# Patient Record
Sex: Female | Born: 1990 | Race: White | Hispanic: No | Marital: Single | Smoking: Never smoker
Health system: Southern US, Community
[De-identification: ages and names within clinical notes are randomized; demographics above are authoritative.]

## PROBLEM LIST (undated history)

## (undated) DIAGNOSIS — N200 Calculus of kidney: Secondary | ICD-10-CM

## (undated) DIAGNOSIS — N83209 Unspecified ovarian cyst, unspecified side: Secondary | ICD-10-CM

## (undated) HISTORY — PX: MYRINGOTOMY: SUR874

## (undated) HISTORY — PX: LITHOTRIPSY: SUR834

## (undated) HISTORY — PX: KIDNEY STONE SURGERY: SHX686

## (undated) HISTORY — PX: ADENOIDECTOMY: SUR15

---
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
## 2011-06-12 ENCOUNTER — Emergency Department (HOSPITAL_COMMUNITY)
Admission: EM | Admit: 2011-06-12 | Discharge: 2011-06-13 | Discharge status: Against Medical Advice | Payer: BC Managed Care – HMO | Attending: Emergency Medicine | Admitting: Emergency Medicine

## 2011-06-12 ENCOUNTER — Encounter: Payer: Self-pay | Admitting: *Deleted

## 2011-06-12 DIAGNOSIS — R109 Unspecified abdominal pain: Secondary | ICD-10-CM | POA: Insufficient documentation

## 2011-06-12 HISTORY — DX: Unspecified ovarian cyst, unspecified side: N83.209

## 2011-06-12 HISTORY — DX: Calculus of kidney: N20.0

## 2011-06-12 NOTE — ED Notes (Signed)
C/o R flank pain, "feels like last kidney stone", pinpoints to bilateral flank pain, radiating into bilateral groin, L>R, also nv, (denies: diarrhea, hematuria, radiation of pain down leg, vaginal sx or fever). Last kidney stone ~25yr ago, has a GU MD in [place]. Has had 2-3 lithotripsy procedures in past.

## 2011-06-13 LAB — POCT I-STAT, CHEM 8
BUN: 11 mg/dL (ref 6–23)
Hemoglobin: 13.6 g/dL (ref 12.0–15.0)
Sodium: 139 mEq/L (ref 135–145)
TCO2: 22 mmol/L (ref 0–100)

## 2011-06-13 LAB — URINALYSIS, ROUTINE W REFLEX MICROSCOPIC
Glucose, UA: NEGATIVE mg/dL
Leukocytes, UA: NEGATIVE
Protein, ur: NEGATIVE mg/dL
Specific Gravity, Urine: 1.018 (ref 1.005–1.030)

## 2011-06-13 LAB — CBC
MCH: 27 pg (ref 26.0–34.0)
Platelets: 271 10*3/uL (ref 150–400)
RBC: 4.55 MIL/uL (ref 3.87–5.11)
WBC: 8.4 10*3/uL (ref 4.0–10.5)

## 2011-06-13 LAB — DIFFERENTIAL
Eosinophils Absolute: 0.1 10*3/uL (ref 0.0–0.7)
Lymphocytes Relative: 41 % (ref 12–46)
Lymphs Abs: 3.4 10*3/uL (ref 0.7–4.0)
Neutro Abs: 4.4 10*3/uL (ref 1.7–7.7)
Neutrophils Relative %: 52 % (ref 43–77)

## 2011-06-13 NOTE — ED Provider Notes (Signed)
20 year old female with report of flank pain. When I went to evaluate the patient she requested to leave AMA. Patient appears to be in no acute distress, and understands risks of leaving prior to completion of workup. Patient was given [facility] paperwork and will be discharged as an AMA. No formal history of present illness or physical exam was done on this patient  Olivia Mackie, MD 06/13/11 [phone]

## 2011-11-19 ENCOUNTER — Emergency Department (INDEPENDENT_AMBULATORY_CARE_PROVIDER_SITE_OTHER): Payer: BC Managed Care – HMO

## 2011-11-19 ENCOUNTER — Encounter (HOSPITAL_COMMUNITY): Payer: Self-pay

## 2011-11-19 ENCOUNTER — Emergency Department (INDEPENDENT_AMBULATORY_CARE_PROVIDER_SITE_OTHER)
Admission: EM | Admit: 2011-11-19 | Discharge: 2011-11-19 | Disposition: A | Discharge status: Home / Self Care | Payer: BC Managed Care – HMO | Source: Home / Self Care

## 2011-11-19 DIAGNOSIS — IMO0002 Reserved for concepts with insufficient information to code with codable children: Secondary | ICD-10-CM

## 2011-11-19 DIAGNOSIS — S8000XA Contusion of unspecified knee, initial encounter: Secondary | ICD-10-CM

## 2011-11-19 DIAGNOSIS — S80219A Abrasion, unspecified knee, initial encounter: Secondary | ICD-10-CM

## 2011-11-19 MED ORDER — IBUPROFEN 800 MG PO TABS: 800.0000 mg | ORAL_TABLET | Freq: Three times a day (TID) | ORAL | Status: AC

## 2011-11-19 MED ORDER — BACITRACIN 500 UNIT/GM EX OINT: 1.0000 "application " | TOPICAL_OINTMENT | Freq: Once | CUTANEOUS | Status: DC

## 2011-11-19 NOTE — ED Notes (Signed)
Pt fell last pm and has abrasions on both knees, no active bleeding noted.

## 2011-11-19 NOTE — Discharge Instructions (Signed)
Ice your knees 3-4 times a day for 15-20 minutes or more often as needed for discomfort. Wash the wounds twice daily with soap and water. Then apply an antibiotic ointment and a clean dressing. Watch for signs of infection, such as redness, swelling or drainage. If this develops return as soon as possible for recheck.   Abrasions Abrasions are skin scrapes. Their treatment depends on how large and deep the abrasion is. Abrasions do not extend through all layers of the skin. A cut or lesion through all skin layers is called a laceration. HOME CARE INSTRUCTIONS   If you were given a dressing, change it at least once a day or as instructed by your caregiver. If the bandage sticks, soak it off with a solution of water or hydrogen peroxide.   Twice a day, wash the area with soap and water to remove all the cream/ointment. You may do this in a sink, under a tub faucet, or in a shower. Rinse off the soap and pat dry with a clean towel. Look for signs of infection (see below).   Reapply cream/ointment according to your caregiver's instruction. This will help prevent infection and keep the bandage from sticking. Telfa or gauze over the wound and under the dressing or wrap will also help keep the bandage from sticking.   If the bandage becomes wet, dirty, or develops a foul smell, change it as soon as possible.   Only take over-the-counter or prescription medicines for pain, discomfort, or fever as directed by your caregiver.  SEEK IMMEDIATE MEDICAL CARE IF:   Increasing pain in the wound.   Signs of infection develop: redness, swelling, surrounding area is tender to touch, or pus coming from the wound.   You have a fever.   Any foul smell coming from the wound or dressing.  Most skin wounds heal within ten days. Facial wounds heal faster. However, an infection may occur despite proper treatment. You should have the wound checked for signs of infection within 24 to 48 hours or sooner if problems  arise. If you were not given a wound-check appointment, look closely at the wound yourself on the second day for early signs of infection listed above. MAKE SURE YOU:   Understand these instructions.   Will watch your condition.   Will get help right away if you are not doing well or get worse.  Document Released: 05/04/2005 Document Revised: 07/14/2011 Document Reviewed: 06/28/2011 [facility] Patient Information 2012 [place], [place].

## 2011-11-19 NOTE — ED Provider Notes (Signed)
Medical screening examination/treatment/procedure(s) were performed by non-physician practitioner and as supervising physician I was immediately available for consultation/collaboration.  Alen Bleacher, MD 11/19/11 2102

## 2011-11-19 NOTE — ED Provider Notes (Signed)
History     CSN: [id number]  Arrival date & time 11/19/11  1538   None     Chief Complaint  Patient presents with  . Knee Injury    (Consider location/radiation/quality/duration/timing/severity/associated sxs/prior treatment) HPI Comments: Patient states that she fell last night landing on both of her knees. She presents with abrasions of both knees, and also complains of knee pain and swelling. Pain worsens with movement and walking. She has not taken anything for her discomfort.   Past Medical History  Diagnosis Date  . Kidney stone   . Kidney stone   . Kidney stones   . Ovarian cyst     Past Surgical History  Procedure Date  . Lithotripsy   . Kidney stone surgery   . Myringotomy   . Adenoidectomy     History reviewed. No pertinent family history.  History  Substance Use Topics  . Smoking status: Never Smoker   . Smokeless tobacco: Not on file  . Alcohol Use: No    OB History    Grav Para Term Preterm Abortions TAB SAB Ect Mult Living                  Review of Systems  Constitutional: Negative for fever and chills.  Musculoskeletal: Positive for joint swelling.  Skin: Positive for wound.  Neurological: Negative for weakness and numbness.    Allergies  Penicillins  Home Medications   Current Outpatient Rx  Name Route Sig Dispense Refill  . IBUPROFEN 800 MG PO TABS Oral Take 1 tablet (800 mg total) by mouth 3 (three) times daily. 15 tablet 0  . NORETHINDRONE ACET-ETHINYL EST 1-20 MG-MCG PO TABS Oral Take 1 tablet by mouth daily.        BP 123/66  Pulse 88  Temp(Src) 98.1 F (36.7 C) (Oral)  Resp 18  SpO2 100%  LMP 10/29/2011  Physical Exam  Nursing note and vitals reviewed. Constitutional: She appears well-developed and well-nourished. No distress.  HENT:  Head: Normocephalic and atraumatic.  Musculoskeletal:       Right knee: She exhibits swelling (anterior knee) and bony tenderness (TTP patella and tuberosity of tibia. ). She  exhibits normal range of motion, no ecchymosis, no laceration, no erythema, normal alignment, no LCL laxity, normal patellar mobility and no MCL laxity. No medial joint line, no lateral joint line, no MCL, no LCL and no patellar tendon tenderness noted.       Left knee: She exhibits normal range of motion, no swelling, no effusion, no ecchymosis, no deformity, no laceration, no erythema, normal alignment, no LCL laxity, normal patellar mobility, no bony tenderness and no MCL laxity. No medial joint line, no lateral joint line, no MCL, no LCL and no patellar tendon tenderness noted.       Abrasions noted bilat anterior knees.   Neurological: She is alert.  Skin: Skin is warm and dry.  Psychiatric: She has a normal mood and affect.    ED Course  Procedures (including critical care time)  Labs Reviewed - No data to display Dg Knee Complete 4 Views Right  11/19/2011  *RADIOLOGY REPORT*  Clinical Data: Right knee pain/abrasion  RIGHT KNEE - COMPLETE 4+ VIEW  Comparison: None.  Findings: No fracture or dislocation is seen.  The joint spaces are preserved.  Tiny radiopaque density in the anterior infrapatellar soft tissues, correlate for radiopaque foreign body.  No suprapatellar knee joint effusion.  IMPRESSION: No fracture or dislocation is seen.  Tiny radiopaque density in  the anterior infrapatellar soft tissues, correlate for radiopaque foreign body.  Original Report Authenticated By: Charline Bills, M.D.     1. Abrasion of knee   2. Contusion of knee       MDM  Xray reviewed by myself and radiologist.        Melody Comas, PA 11/19/11 [phone]

## 2013-05-31 ENCOUNTER — Emergency Department (HOSPITAL_COMMUNITY): Payer: BC Managed Care – HMO

## 2013-05-31 ENCOUNTER — Emergency Department (HOSPITAL_COMMUNITY)
Admission: EM | Admit: 2013-05-31 | Discharge: 2013-06-01 | Disposition: A | Discharge status: Home / Self Care | Payer: BC Managed Care – HMO | Attending: Emergency Medicine | Admitting: Emergency Medicine

## 2013-05-31 ENCOUNTER — Emergency Department (HOSPITAL_COMMUNITY)
Admission: EM | Admit: 2013-05-31 | Discharge: 2013-05-31 | Disposition: A | Discharge status: Home / Self Care | Payer: BC Managed Care – HMO | Attending: Emergency Medicine | Admitting: Emergency Medicine

## 2013-05-31 ENCOUNTER — Encounter (HOSPITAL_COMMUNITY): Payer: Self-pay | Admitting: Emergency Medicine

## 2013-05-31 DIAGNOSIS — R112 Nausea with vomiting, unspecified: Secondary | ICD-10-CM | POA: Insufficient documentation

## 2013-05-31 DIAGNOSIS — Z880 Allergy status to penicillin: Secondary | ICD-10-CM | POA: Insufficient documentation

## 2013-05-31 DIAGNOSIS — R300 Dysuria: Secondary | ICD-10-CM | POA: Insufficient documentation

## 2013-05-31 DIAGNOSIS — R319 Hematuria, unspecified: Secondary | ICD-10-CM | POA: Insufficient documentation

## 2013-05-31 DIAGNOSIS — R1032 Left lower quadrant pain: Secondary | ICD-10-CM | POA: Insufficient documentation

## 2013-05-31 DIAGNOSIS — Z8742 Personal history of other diseases of the female genital tract: Secondary | ICD-10-CM | POA: Insufficient documentation

## 2013-05-31 DIAGNOSIS — R109 Unspecified abdominal pain: Secondary | ICD-10-CM

## 2013-05-31 DIAGNOSIS — Z3202 Encounter for pregnancy test, result negative: Secondary | ICD-10-CM | POA: Insufficient documentation

## 2013-05-31 DIAGNOSIS — N200 Calculus of kidney: Secondary | ICD-10-CM | POA: Insufficient documentation

## 2013-05-31 DIAGNOSIS — Z791 Long term (current) use of non-steroidal anti-inflammatories (NSAID): Secondary | ICD-10-CM | POA: Insufficient documentation

## 2013-05-31 DIAGNOSIS — Z87442 Personal history of urinary calculi: Secondary | ICD-10-CM | POA: Insufficient documentation

## 2013-05-31 LAB — CBC WITH DIFFERENTIAL/PLATELET
Basophils Relative: 0 % (ref 0–1)
Eosinophils Absolute: 0.1 10*3/uL (ref 0.0–0.7)
HCT: 37.6 % (ref 36.0–46.0)
Hemoglobin: 12.4 g/dL (ref 12.0–15.0)
MCH: 27.6 pg (ref 26.0–34.0)
MCHC: 33 g/dL (ref 30.0–36.0)
Monocytes Absolute: 0.5 10*3/uL (ref 0.1–1.0)
Monocytes Relative: 5 % (ref 3–12)
Neutro Abs: 5.6 10*3/uL (ref 1.7–7.7)

## 2013-05-31 LAB — COMPREHENSIVE METABOLIC PANEL
ALT: 20 U/L (ref 0–35)
AST: 17 U/L (ref 0–37)
Calcium: 9.9 mg/dL (ref 8.4–10.5)
Sodium: 136 mEq/L (ref 135–145)
Total Protein: 7.7 g/dL (ref 6.0–8.3)

## 2013-05-31 LAB — URINALYSIS, ROUTINE W REFLEX MICROSCOPIC
Protein, ur: NEGATIVE mg/dL
Specific Gravity, Urine: 1.022 (ref 1.005–1.030)
Urobilinogen, UA: 0.2 mg/dL (ref 0.0–1.0)
Urobilinogen, UA: 1 mg/dL (ref 0.0–1.0)

## 2013-05-31 LAB — URINE MICROSCOPIC-ADD ON

## 2013-05-31 MED ORDER — OXYCODONE-ACETAMINOPHEN 10-325 MG PO TABS: 0.5000 | ORAL_TABLET | ORAL | Status: DC | PRN

## 2013-05-31 MED ORDER — ONDANSETRON HCL 4 MG/2ML IJ SOLN
4.0000 mg | Freq: Once | INTRAMUSCULAR | Status: AC
  Administered 2013-05-31: 4 mg via INTRAVENOUS
  Filled 2013-05-31: qty 2

## 2013-05-31 MED ORDER — SODIUM CHLORIDE 0.9 % IV BOLUS (SEPSIS)
1000.0000 mL | Freq: Once | INTRAVENOUS | Status: AC
  Administered 2013-05-31: 1000 mL via INTRAVENOUS

## 2013-05-31 MED ORDER — NAPROXEN 500 MG PO TABS: 500.0000 mg | ORAL_TABLET | Freq: Two times a day (BID) | ORAL | Status: DC

## 2013-05-31 MED ORDER — MORPHINE SULFATE 2 MG/ML IJ SOLN
2.0000 mg | Freq: Once | INTRAMUSCULAR | Status: AC
  Administered 2013-05-31: 2 mg via INTRAVENOUS
  Filled 2013-05-31: qty 1

## 2013-05-31 MED ORDER — KETOROLAC TROMETHAMINE 30 MG/ML IJ SOLN
30.0000 mg | Freq: Once | INTRAMUSCULAR | Status: AC
  Administered 2013-05-31: 30 mg via INTRAVENOUS
  Filled 2013-05-31: qty 1

## 2013-05-31 MED ORDER — ONDANSETRON HCL 8 MG PO TABS: 4.0000 mg | ORAL_TABLET | Freq: Three times a day (TID) | ORAL | Status: DC | PRN

## 2013-05-31 MED ORDER — TAMSULOSIN HCL 0.4 MG PO CAPS: 0.4000 mg | ORAL_CAPSULE | Freq: Every day | ORAL | Status: DC

## 2013-05-31 MED ORDER — HYDROMORPHONE HCL PF 1 MG/ML IJ SOLN
1.0000 mg | Freq: Once | INTRAMUSCULAR | Status: AC
  Administered 2013-05-31: 1 mg via INTRAVENOUS
  Filled 2013-05-31: qty 1

## 2013-05-31 NOTE — ED Notes (Signed)
PT states that she began to have LLQ pain last pm; pt states that she was seen last pm for same thing; pt states that she was given Naproxen and [facility]; pt states that she has continued to have LLQ pain that has not been relieved with the medication; pt states that she has had nausea and vomit x2 today.

## 2013-05-31 NOTE — ED Provider Notes (Signed)
CSN: [id number]     Arrival date & time 05/31/13  2007 History   First MD Initiated Contact with Patient 05/31/13 2032     Chief Complaint  Patient presents with  . Abdominal Pain   (Consider location/radiation/quality/duration/timing/severity/associated sxs/prior Treatment) HPI  April Horton 22 year old female presents the emergency department chief complaint of left flank pain. Patient was seen early this morning by PA Dammen. Patient has a history of multiple kidney stones.  She states that she has had left pain radiating to the left lower quadrant of the abdomen that began last night around 11 PM.  He has been progressively worsening.  She said that yesterday her urinalysis here in the emergency department was negative.  However she followed up at the clinic at her school today and had positive blood in her urine.  Patient returns to the emergency department for treatment of pain.  She does have multiple episodes of nausea and vomiting.  Last menstrual period was one week ago.She denies any vaginal bleeding or vaginal discharge.  She has not used any medications or treatment for symptoms. Denies any other aggravating or alleviating factors. No other associated symptoms. No fever, chills or sweats.   Past Medical History  Diagnosis Date  . Kidney stone   . Kidney stone   . Kidney stones   . Ovarian cyst    Past Surgical History  Procedure Laterality Date  . Lithotripsy    . Kidney stone surgery    . Myringotomy    . Adenoidectomy     No family history on file. History  Substance Use Topics  . Smoking status: Never Smoker   . Smokeless tobacco: Not on file  . Alcohol Use: No   OB History   Grav Para Term Preterm Abortions TAB SAB Ect Mult Living                 Review of Systems Ten systems reviewed and are negative for acute change, except as noted in the HPI.   Allergies  Demerol; Penicillins; and Shellfish allergy  Home Medications   Current  Outpatient Rx  Name  Route  Sig  Dispense  Refill  . naproxen (NAPROSYN) 500 MG tablet   Oral   Take 1 tablet (500 mg total) by mouth 2 (two) times daily.   30 tablet   0    BP 133/72  Pulse 95  Temp(Src) 98.4 F (36.9 C) (Oral)  Resp 16  SpO2 96%  LMP 05/25/2013 Physical Exam Physical Exam  Nursing note and vitals reviewed. Constitutional: She is oriented to person, place, and time.  Patient is tearful but stoic. HENT:  Head: Normocephalic and atraumatic.  Eyes: Conjunctivae normal and EOM are normal. Pupils are equal, round, and reactive to light. No scleral icterus.  Neck: Normal range of motion.  Cardiovascular: Normal rate, regular rhythm and normal heart sounds.  Exam reveals no gallop and no friction rub.   No murmur heard. Pulmonary/Chest: Effort normal and breath sounds normal. No respiratory distress.  Abdominal: Soft. Bowel sounds are normal. She exhibits no distension and no mass. There is no tenderness. +CVA tenderness L side Neurological: She is alert and oriented to person, place, and time.  Skin: Skin is warm and dry. She is not diaphoretic.    ED Course  Procedures (including critical care time) Labs Review Labs Reviewed  URINALYSIS, ROUTINE W REFLEX MICROSCOPIC   Imaging Review No results found.  EKG Interpretation   None  MDM   1. Kidney stone     11:14 PM BP 133/72  Pulse 95  Temp(Src) 98.4 F (36.9 C) (Oral)  Resp 16  SpO2 96%  LMP 05/25/2013 Patient with sxs consistent with her previous kidney stones. She has no LLQ abdominal pain  On exam.  She again declines Pelvic examination. I have discussed the differential dx of LQ abdominal pain and risks with the patient who appears to be competent to make the decision. KUB shows Large stool burden. Her UA shows moderate hgb and white cells. Patient is resting comfortably.  12:24 AM She has a history of litotropsy. Will get Renal US to look for hydronephrosis.    9:59 AM BP  116/57  Pulse 79  Temp(Src) 98.4 F (36.9 C) (Oral)  Resp 18  SpO2 99%  LMP 05/25/2013 Patient [place] negative for signs of obstruction.  Patient denies any further pain at this time. Patient advised to take laxative for large stool burden follow up this week with urology. Arthor Captain, PA-C 06/01/13 1000

## 2013-05-31 NOTE — ED Notes (Signed)
Pt reports back pain that started one week ago and now moved to the lower pelvis area. Pain accompanied with nausea and vomittingx1 and blood in urine.

## 2013-05-31 NOTE — ED Notes (Signed)
Patient is alert and oriented x3.  She was given DC instructions and follow up visit instructions.  Patient gave verbal understanding. She was DC ambulatory under her own power to home.  V/S stable.  He was not showing any signs of distress on DC 

## 2013-05-31 NOTE — ED Provider Notes (Signed)
Medical screening examination/treatment/procedure(s) were performed by non-physician practitioner and as supervising physician I was immediately available for consultation/collaboration.  EKG Interpretation   None        Zandrea Kenealy, MD 05/31/13 0456 

## 2013-05-31 NOTE — ED Provider Notes (Signed)
CSN: [id number]     Arrival date & time 05/31/13  0039 History   First MD Initiated Contact with Patient 05/31/13 0102     Chief Complaint  Patient presents with  . Abdominal Pain  . Emesis   HPI  History provided by the patient. Patient is a 22 year old female with previous history of kidney stones with lithotripsy who presents with complaints of left flank and lower abdominal pain. Pain began around 11 PM and has worsened. Pain feels similar to previous kidney stones. It has been associated with episodes of nausea and vomiting. Patient also reports having slight dysuria and hematuria. She denies any vaginal bleeding or vaginal discharge. Her last normal menstrual cycle was one week ago. She has not used any medications or treatment for symptoms. Denies any other aggravating or alleviating factors. No other associated symptoms. No fever, chills or sweats.    Past Medical History  Diagnosis Date  . Kidney stone   . Kidney stone   . Kidney stones   . Ovarian cyst    Past Surgical History  Procedure Laterality Date  . Lithotripsy    . Kidney stone surgery    . Myringotomy    . Adenoidectomy     History reviewed. No pertinent family history. History  Substance Use Topics  . Smoking status: Never Smoker   . Smokeless tobacco: Not on file  . Alcohol Use: No   OB History   Grav Para Term Preterm Abortions TAB SAB Ect Mult Living                 Review of Systems  Constitutional: Negative for fever, chills and diaphoresis.  Respiratory: Negative for shortness of breath.   Cardiovascular: Negative for chest pain.  Gastrointestinal: Positive for nausea, vomiting and abdominal pain. Negative for diarrhea and constipation.  Genitourinary: Positive for dysuria, hematuria and flank pain. Negative for frequency, vaginal bleeding, vaginal discharge and menstrual problem.  All other systems reviewed and are negative.    Allergies  Demerol; Penicillins; and Shellfish allergy  Home  Medications  No current outpatient prescriptions on file. BP 117/67  Pulse 93  Temp(Src) 97.6 F (36.4 C) (Oral)  Resp 18  SpO2 98%  LMP 05/25/2013 Physical Exam  Nursing note and vitals reviewed. Constitutional: She is oriented to person, place, and time. She appears well-developed and well-nourished. No distress.  HENT:  Head: Normocephalic.  Cardiovascular: Normal rate and regular rhythm.   No murmur heard. Pulmonary/Chest: Effort normal and breath sounds normal. No respiratory distress. She has no wheezes. She has no rales.  Abdominal: Soft. There is tenderness in the left lower quadrant. There is CVA tenderness (Left). There is no rigidity, no rebound, no guarding and no tenderness at McBurney's point.  Musculoskeletal: Normal range of motion.  Neurological: She is alert and oriented to person, place, and time.  Skin: Skin is warm and dry. No rash noted.  Psychiatric: She has a normal mood and affect. Her behavior is normal.    ED Course  Procedures   COORDINATION OF CARE:  Nursing notes reviewed. Vital signs reviewed. Initial pt interview and examination performed.   1:56 AM-patient seen and evaluated. Patient appears in mild to moderate discomfort. No acute distress. She reports symptoms similar to previous kidney stones. Discussed work up plan with pt at bedside, which includes urinalysis, lab testing and treatment for pain and nausea. Pt agrees with plan.  Patient reports having improvement after medications. Her lab testing and urinalysis has not shown  any signs of concerning cause to explain her symptoms. I discussed with the patient recommendations to perform a pelvic exam to be sure symptoms were not caused by gynecological problem. Patient does not wish to have a pelvic exam and refused. I did discuss possible causes of her pain including possible ovarian torsion, ovarian cysts, pelvic infection, kidney stone, and colitis. I discussed with the patient's the possible  risks of no further testing as well as the alternatives to testing. At this time patient feels improved does not wish to have further testing. I gave her strong return precautions and reminded her that she may return at any time for further evaluation. I also strongly encouraged a recheck of symptoms in the next 24 hours. Patient expressed her understanding.   Treatment plan initiated: Medications  sodium chloride 0.9 % bolus 1,000 mL (not administered)  ketorolac (TORADOL) 30 MG/ML injection 30 mg (30 mg Intravenous Given 05/31/13 0209)  ondansetron (ZOFRAN) injection 4 mg (4 mg Intravenous Given 05/31/13 0209)  morphine 2 MG/ML injection 2 mg (2 mg Intravenous Given 05/31/13 0209)   Results for orders placed during the hospital encounter of 05/31/13  URINALYSIS, ROUTINE W REFLEX MICROSCOPIC      Result Value Range   Color, Urine YELLOW  YELLOW   APPearance CLEAR  CLEAR   Specific Gravity, Urine 1.020  1.005 - 1.030   pH 6.0  5.0 - 8.0   Glucose, UA NEGATIVE  NEGATIVE mg/dL   Hgb urine dipstick NEGATIVE  NEGATIVE   Bilirubin Urine NEGATIVE  NEGATIVE   Ketones, ur NEGATIVE  NEGATIVE mg/dL   Protein, ur NEGATIVE  NEGATIVE mg/dL   Urobilinogen, UA 0.2  0.0 - 1.0 mg/dL   Nitrite NEGATIVE  NEGATIVE   Leukocytes, UA SMALL (*) NEGATIVE  COMPREHENSIVE METABOLIC PANEL      Result Value Range   Sodium 136  135 - 145 mEq/L   Potassium 4.4  3.5 - 5.1 mEq/L   Chloride 102  96 - 112 mEq/L   CO2 23  19 - 32 mEq/L   Glucose, Bld 118 (*) 70 - 99 mg/dL   BUN 17  6 - 23 mg/dL   Creatinine, Ser 1.61  0.50 - 1.10 mg/dL   Calcium 9.9  8.4 - 09.6 mg/dL   Total Protein 7.7  6.0 - 8.3 g/dL   Albumin 4.1  3.5 - 5.2 g/dL   AST 17  0 - 37 U/L   ALT 20  0 - 35 U/L   Alkaline Phosphatase 105  39 - 117 U/L   Total Bilirubin 0.2 (*) 0.3 - 1.2 mg/dL   GFR calc non Af Amer >90  >90 mL/min   GFR calc Af Amer >90  >90 mL/min  CBC WITH DIFFERENTIAL      Result Value Range   WBC 9.8  4.0 - 10.5 K/uL   RBC  4.49  3.87 - 5.11 MIL/uL   Hemoglobin 12.4  12.0 - 15.0 g/dL   HCT 04.5  40.9 - 81.1 %   MCV 83.7  78.0 - 100.0 fL   MCH 27.6  26.0 - 34.0 pg   MCHC 33.0  30.0 - 36.0 g/dL   RDW 91.4  78.2 - 95.6 %   Platelets 300  150 - 400 K/uL   Neutrophils Relative % 58  43 - 77 %   Neutro Abs 5.6  1.7 - 7.7 K/uL   Lymphocytes Relative 35  12 - 46 %   Lymphs Abs 3.5  0.7 -  4.0 K/uL   Monocytes Relative 5  3 - 12 %   Monocytes Absolute 0.5  0.1 - 1.0 K/uL   Eosinophils Relative 1  0 - 5 %   Eosinophils Absolute 0.1  0.0 - 0.7 K/uL   Basophils Relative 0  0 - 1 %   Basophils Absolute 0.0  0.0 - 0.1 K/uL  URINE MICROSCOPIC-ADD ON      Result Value Range   Squamous Epithelial / LPF FEW (*) RARE   WBC, UA 3-6  <3 WBC/hpf   Bacteria, UA RARE  RARE  POCT PREGNANCY, URINE      Result Value Range   Preg Test, Ur NEGATIVE  NEGATIVE         MDM   1. Abdominal pain   2. Flank pain        Angus Seller, PA-C 05/31/13 [phone]

## 2013-06-01 ENCOUNTER — Emergency Department (HOSPITAL_COMMUNITY): Payer: BC Managed Care – HMO

## 2013-06-01 NOTE — ED Provider Notes (Signed)
Medical screening examination/treatment/procedure(s) were performed by non-physician practitioner and as supervising physician I was immediately available for consultation/collaboration.  Kaiah Hosea T Blayke Pinera, MD 06/01/13 2238 

## 2013-06-01 NOTE — ED Notes (Signed)
U/s at bedside

## 2013-06-02 LAB — URINE CULTURE

## 2013-09-21 IMAGING — CR DG KNEE COMPLETE 4+V*R*
4 series · 4 of 4 positions shown · non-contrast
Comparison: None.

CLINICAL DATA: Right knee pain/abrasion

RIGHT KNEE - COMPLETE 4+ VIEW

[view not recorded (1 of 4)]
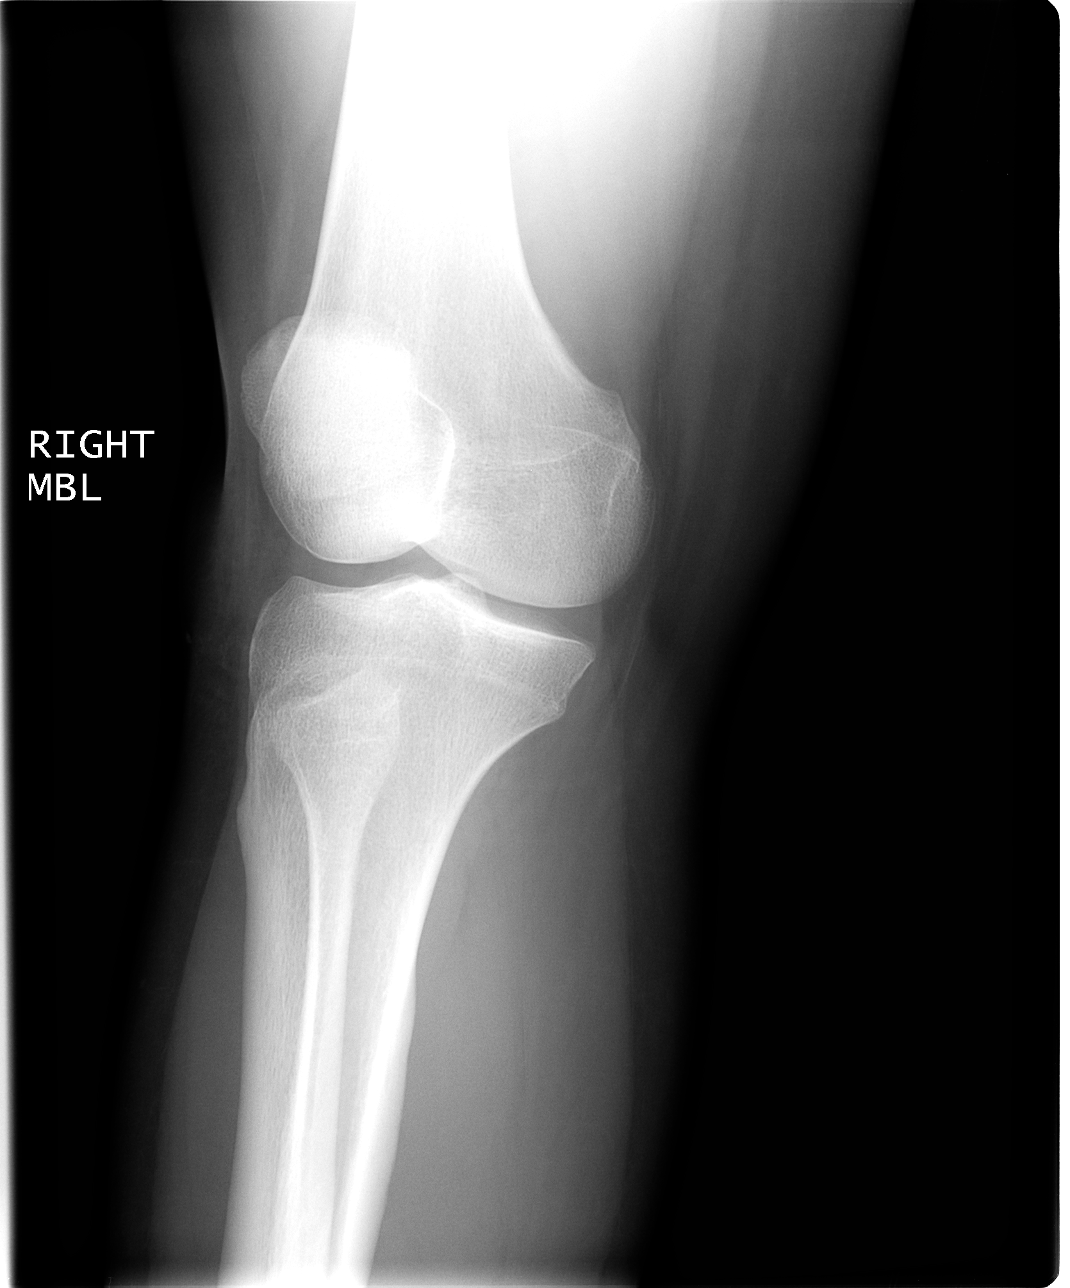

[view not recorded (2 of 4)]
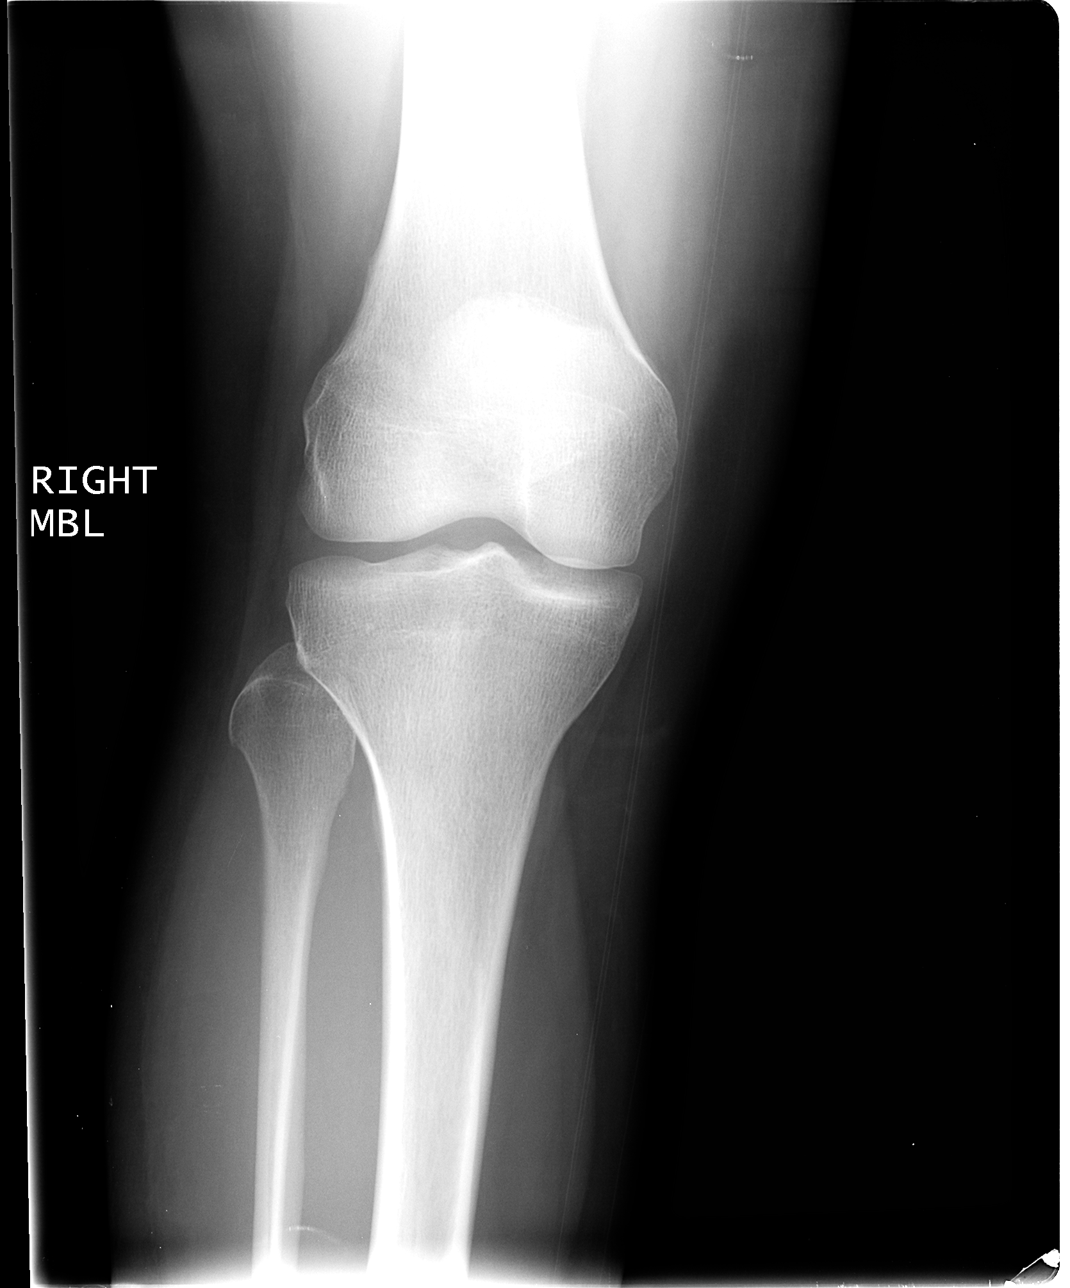

[view not recorded (3 of 4)]
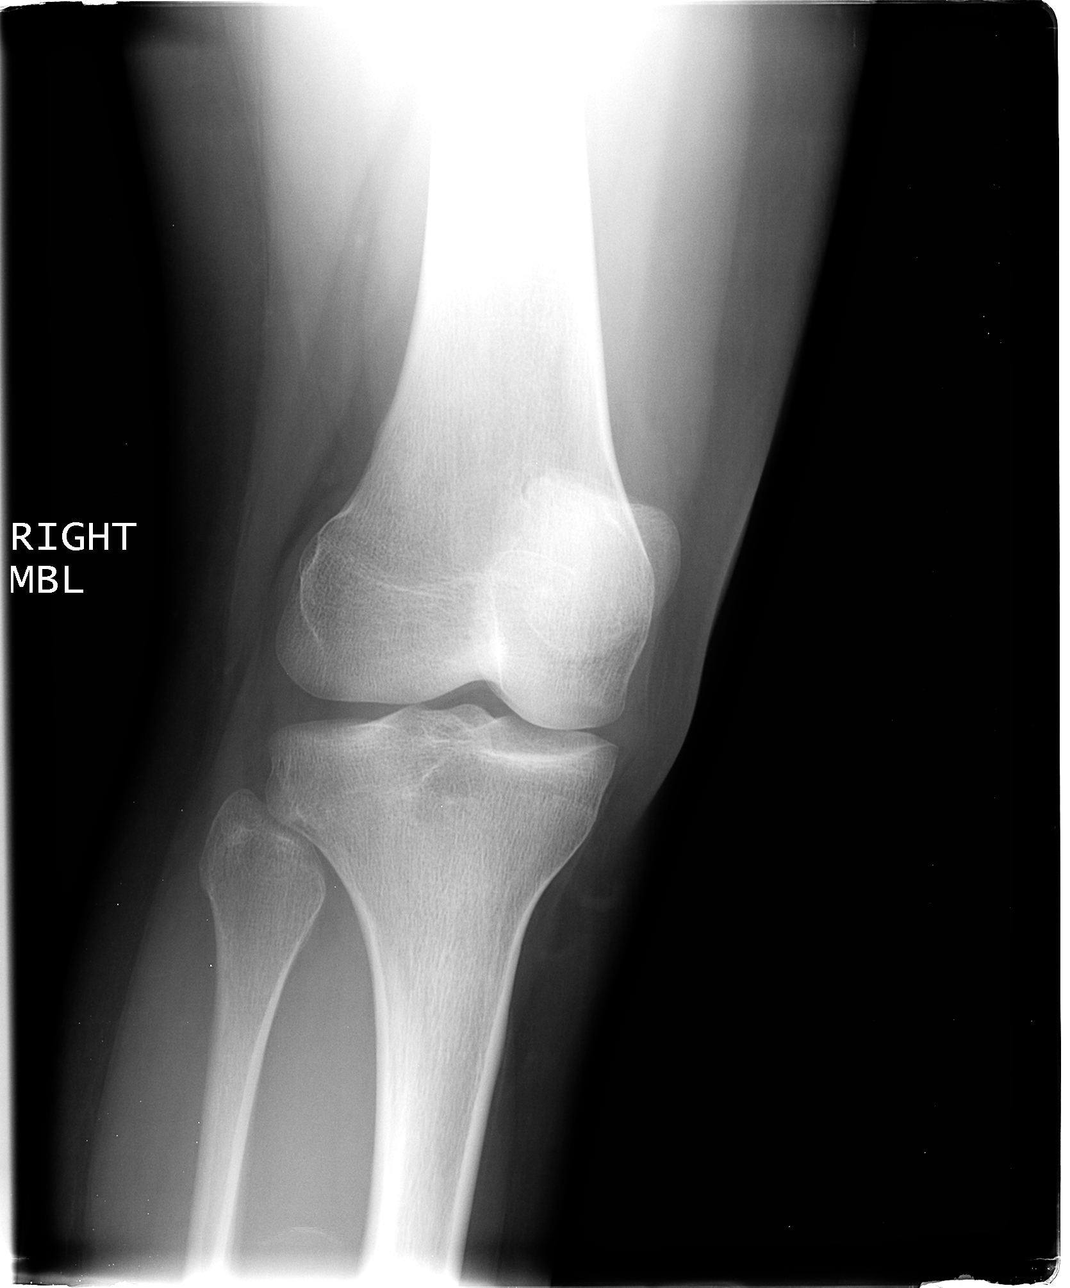

[view not recorded (4 of 4)]
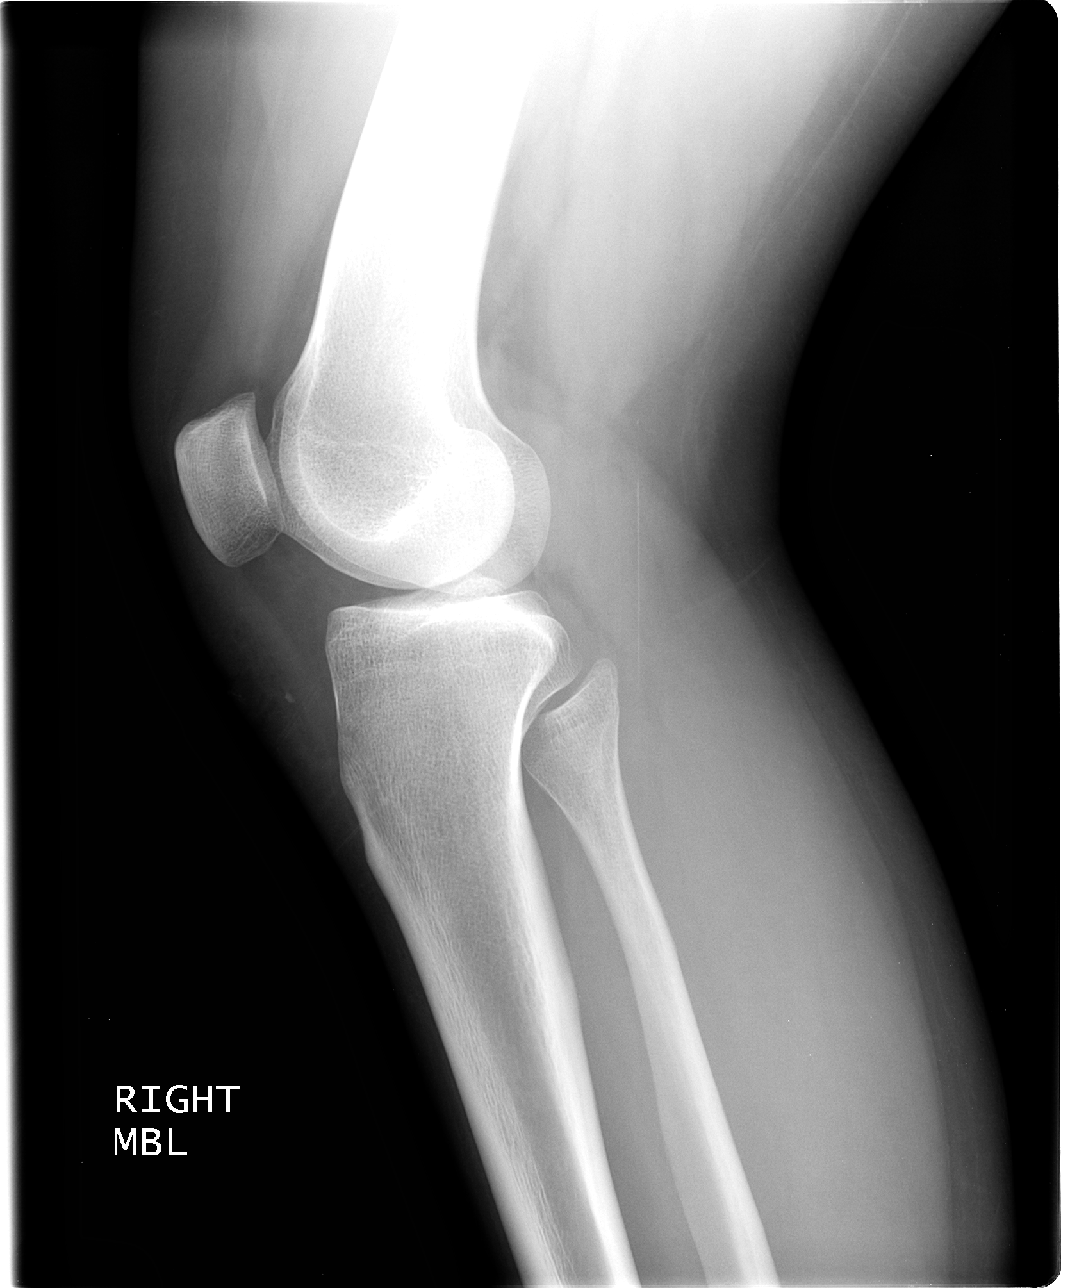

[4 of 4 positions shown; findings below may reference images not displayed]

FINDINGS: No fracture or dislocation is seen.

The joint spaces are preserved.

Tiny radiopaque density in the anterior infrapatellar soft tissues,
correlate for radiopaque foreign body.

No suprapatellar knee joint effusion.
IMPRESSION: No fracture or dislocation is seen.

Tiny radiopaque density in the anterior infrapatellar soft tissues,
correlate for radiopaque foreign body.

## 2013-11-17 ENCOUNTER — Emergency Department (HOSPITAL_COMMUNITY): Payer: BC Managed Care – HMO

## 2013-11-17 ENCOUNTER — Encounter (HOSPITAL_COMMUNITY): Payer: Self-pay | Admitting: Emergency Medicine

## 2013-11-17 ENCOUNTER — Emergency Department (HOSPITAL_COMMUNITY)
Admission: EM | Admit: 2013-11-17 | Discharge: 2013-11-17 | Disposition: A | Discharge status: Home / Self Care | Payer: BC Managed Care – HMO | Attending: Emergency Medicine | Admitting: Emergency Medicine

## 2013-11-17 DIAGNOSIS — R112 Nausea with vomiting, unspecified: Secondary | ICD-10-CM | POA: Insufficient documentation

## 2013-11-17 DIAGNOSIS — Z9889 Other specified postprocedural states: Secondary | ICD-10-CM | POA: Insufficient documentation

## 2013-11-17 DIAGNOSIS — Z880 Allergy status to penicillin: Secondary | ICD-10-CM | POA: Insufficient documentation

## 2013-11-17 DIAGNOSIS — Z8742 Personal history of other diseases of the female genital tract: Secondary | ICD-10-CM | POA: Insufficient documentation

## 2013-11-17 DIAGNOSIS — R319 Hematuria, unspecified: Secondary | ICD-10-CM | POA: Insufficient documentation

## 2013-11-17 DIAGNOSIS — Z3202 Encounter for pregnancy test, result negative: Secondary | ICD-10-CM | POA: Insufficient documentation

## 2013-11-17 DIAGNOSIS — Z79899 Other long term (current) drug therapy: Secondary | ICD-10-CM | POA: Insufficient documentation

## 2013-11-17 DIAGNOSIS — N23 Unspecified renal colic: Secondary | ICD-10-CM

## 2013-11-17 LAB — CBC WITH DIFFERENTIAL/PLATELET
Basophils Absolute: 0 10*3/uL (ref 0.0–0.1)
Basophils Relative: 0 % (ref 0–1)
EOS PCT: 2 % (ref 0–5)
Eosinophils Absolute: 0.2 10*3/uL (ref 0.0–0.7)
HCT: 37.7 % (ref 36.0–46.0)
HEMOGLOBIN: 12.4 g/dL (ref 12.0–15.0)
LYMPHS ABS: 2.3 10*3/uL (ref 0.7–4.0)
Lymphocytes Relative: 25 % (ref 12–46)
MCH: 27.6 pg (ref 26.0–34.0)
MCHC: 32.9 g/dL (ref 30.0–36.0)
MCV: 84 fL (ref 78.0–100.0)
MONOS PCT: 7 % (ref 3–12)
Monocytes Absolute: 0.6 10*3/uL (ref 0.1–1.0)
NEUTROS PCT: 66 % (ref 43–77)
Neutro Abs: 5.9 10*3/uL (ref 1.7–7.7)
Platelets: 244 10*3/uL (ref 150–400)
RBC: 4.49 MIL/uL (ref 3.87–5.11)
RDW: 13.7 % (ref 11.5–15.5)
WBC: 9 10*3/uL (ref 4.0–10.5)

## 2013-11-17 LAB — COMPREHENSIVE METABOLIC PANEL
ALK PHOS: 85 U/L (ref 39–117)
ALT: 25 U/L (ref 0–35)
AST: 20 U/L (ref 0–37)
Albumin: 3.8 g/dL (ref 3.5–5.2)
BUN: 16 mg/dL (ref 6–23)
CO2: 20 mEq/L (ref 19–32)
Calcium: 9.1 mg/dL (ref 8.4–10.5)
Chloride: 103 mEq/L (ref 96–112)
Creatinine, Ser: 0.58 mg/dL (ref 0.50–1.10)
GLUCOSE: 93 mg/dL (ref 70–99)
POTASSIUM: 4.7 meq/L (ref 3.7–5.3)
Sodium: 136 mEq/L — ABNORMAL LOW (ref 137–147)
Total Bilirubin: 0.3 mg/dL (ref 0.3–1.2)
Total Protein: 7.2 g/dL (ref 6.0–8.3)

## 2013-11-17 LAB — URINE MICROSCOPIC-ADD ON

## 2013-11-17 LAB — URINALYSIS, ROUTINE W REFLEX MICROSCOPIC
Bilirubin Urine: NEGATIVE
Glucose, UA: NEGATIVE mg/dL
KETONES UR: 15 mg/dL — AB
Leukocytes, UA: NEGATIVE
Nitrite: NEGATIVE
PROTEIN: NEGATIVE mg/dL
Specific Gravity, Urine: 1.027 (ref 1.005–1.030)
UROBILINOGEN UA: 0.2 mg/dL (ref 0.0–1.0)
pH: 5.5 (ref 5.0–8.0)

## 2013-11-17 LAB — LIPASE, BLOOD: Lipase: 18 U/L (ref 11–59)

## 2013-11-17 LAB — PREGNANCY, URINE: Preg Test, Ur: NEGATIVE

## 2013-11-17 MED ORDER — ONDANSETRON HCL 4 MG/2ML IJ SOLN
4.0000 mg | Freq: Once | INTRAMUSCULAR | Status: AC
  Administered 2013-11-17: 4 mg via INTRAVENOUS
  Filled 2013-11-17: qty 2

## 2013-11-17 MED ORDER — HYDROMORPHONE HCL PF 1 MG/ML IJ SOLN
1.0000 mg | Freq: Once | INTRAMUSCULAR | Status: AC
  Administered 2013-11-17: 1 mg via INTRAVENOUS
  Filled 2013-11-17: qty 1

## 2013-11-17 MED ORDER — ONDANSETRON HCL 4 MG PO TABS: 4.0000 mg | ORAL_TABLET | Freq: Four times a day (QID) | ORAL | Status: AC

## 2013-11-17 MED ORDER — OXYCODONE-ACETAMINOPHEN 5-325 MG PO TABS: 1.0000 | ORAL_TABLET | ORAL | Status: AC | PRN

## 2013-11-17 MED ORDER — KETOROLAC TROMETHAMINE 10 MG PO TABS: 10.0000 mg | ORAL_TABLET | Freq: Four times a day (QID) | ORAL | Status: AC | PRN

## 2013-11-17 NOTE — ED Notes (Signed)
The pt is c/o bi-lateral back pain for 2 days with some lower abd pain also.  She has urinary frequency and feels like she does not empty her bladder when she voids.  lmp April 7th

## 2013-11-17 NOTE — ED Provider Notes (Signed)
CSN: [id number]     Arrival date & time 11/17/13  2002 History   First MD Initiated Contact with Patient 11/17/13 2033     Chief Complaint  Patient presents with  . Back Pain     (Consider location/radiation/quality/duration/timing/severity/associated sxs/prior Treatment) Patient is a 23 y.o. female presenting with flank pain.  Flank Pain This is a new problem. The current episode started yesterday. The problem occurs intermittently. The problem has been waxing and waning. Associated symptoms include nausea, urinary symptoms and vomiting. Pertinent negatives include no abdominal pain, anorexia, arthralgias, change in bowel habit, chest pain, chills, congestion, coughing, diaphoresis, fatigue, fever, headaches, joint swelling, myalgias, neck pain, numbness, rash, sore throat, swollen glands, vertigo, visual change or weakness. Nothing aggravates the symptoms. She has tried nothing for the symptoms.   Patient with PMH kindey stones presents with 2 days of LBP, Flank pain worse on R side. + urinary frequency and feelingss of incomplete emptying. + crescendos of severe, non radiating pain with associated nausea and vomiting. Feels like previous kidney stones. She denies vaginal sxs, fever, abdominal pain. Past Medical History  Diagnosis Date  . Kidney stone   . Kidney stone   . Kidney stones   . Ovarian cyst    Past Surgical History  Procedure Laterality Date  . Lithotripsy    . Kidney stone surgery    . Myringotomy    . Adenoidectomy     No family history on file. History  Substance Use Topics  . Smoking status: Never Smoker   . Smokeless tobacco: Not on file  . Alcohol Use: No   OB History   Grav Para Term Preterm Abortions TAB SAB Ect Mult Living                 Review of Systems  Constitutional: Negative for fever, chills, diaphoresis and fatigue.  HENT: Negative for congestion and sore throat.   Respiratory: Negative for cough.   Cardiovascular: Negative for chest pain.   Gastrointestinal: Positive for nausea and vomiting. Negative for abdominal pain, anorexia and change in bowel habit.  Genitourinary: Positive for flank pain.  Musculoskeletal: Negative for arthralgias, joint swelling, myalgias and neck pain.  Skin: Negative for rash.  Neurological: Negative for vertigo, weakness, numbness and headaches.      Allergies  Demerol; Penicillins; and Shellfish allergy  Home Medications   Current Outpatient Rx  Name  Route  Sig  Dispense  Refill  . PROAIR HFA 108 (90 BASE) MCG/ACT inhaler   Inhalation   Inhale 1 puff into the lungs every 6 (six) hours as needed for wheezing or shortness of breath.           BP 122/74  Pulse 93  Temp(Src) 98 F (36.7 C) (Oral)  Resp 18  Ht 5\' 2"  (1.575 m)  Wt 169 lb 4 oz (76.771 kg)  BMI 30.95 kg/m2  SpO2 100%  LMP 11/11/2013 Physical Exam Physical Exam  Nursing note and vitals reviewed. Constitutional: She is oriented to person, place, and time. She appears well-developed and well-nourished. No distress.  HENT:  Head: Normocephalic and atraumatic.  Eyes: Conjunctivae normal and EOM are normal. Pupils are equal, round, and reactive to light. No scleral icterus.  Neck: Normal range of motion.  Cardiovascular: Normal rate, regular rhythm and normal heart sounds.  Exam reveals no gallop and no friction rub.   No murmur heard. Pulmonary/Chest: Effort normal and breath sounds normal. No respiratory distress.  Abdominal: Soft. Bowel sounds are normal. She  exhibits no distension and no mass. There is no tenderness. There is no guarding. CVA tenderness R side. Neurological: She is alert and oriented to person, place, and time.  Skin: Skin is warm and dry. She is not diaphoretic.    ED Course  Procedures (including critical care time) Labs Review Labs Reviewed  URINALYSIS, ROUTINE W REFLEX MICROSCOPIC - Abnormal; Notable for the following:    APPearance CLOUDY (*)    Hgb urine dipstick LARGE (*)    Ketones,  ur 15 (*)    All other components within normal limits  COMPREHENSIVE METABOLIC PANEL - Abnormal; Notable for the following:    Sodium 136 (*)    All other components within normal limits  PREGNANCY, URINE  URINE MICROSCOPIC-ADD ON  CBC WITH DIFFERENTIAL  LIPASE, BLOOD   Imaging Review US Renal  11/17/2013   CLINICAL DATA:  Back pain  EXAM: RENAL/URINARY TRACT ULTRASOUND COMPLETE  COMPARISON:  US RENAL dated 06/01/2013  FINDINGS: Right Kidney:  Length: 10.6 cm. Echogenicity within normal limits. No mass or hydronephrosis visualized.  Left Kidney:  Length: 11.7 cm. Echogenicity within normal limits. No mass or hydronephrosis visualized.  Bladder:  The bladder is decompressed limiting evaluation.  IMPRESSION: Normal renal ultrasound.   Electronically Signed   By: Elige Ko   On: 11/17/2013 22:43     EKG Interpretation None      MDM   Final diagnoses:  Renal colic  Hematuria    Patient with hemoglobinuria,Hx of multiple kidney stones, this feels the same. There is no evidence of significant hydronephrosis on [place], serum creatine WNL, vitals sign stable and the pt does not have irratractable vomiting. Pt will be dc home with pain medications & has been advised to follow up with PCP.      Arthor Captain, PA-C 11/22/13 [phone]

## 2013-11-17 NOTE — ED Notes (Signed)
Friends at the bedside.

## 2013-11-17 NOTE — ED Notes (Signed)
Patient is alert and orientedx4.  Patient was explained discharge instructions and they understood them with no questions.  The patient's friend, Vinnie LangtonKatelyn Little is taking the patient home.

## 2013-11-17 NOTE — Discharge Instructions (Signed)
Kidney Stones  Kidney stones (urolithiasis) are deposits that form inside your kidneys. The intense pain is caused by the stone moving through the urinary tract. When the stone moves, the ureter goes into spasm around the stone. The stone is usually passed in the urine.   CAUSES   · A disorder that makes certain neck glands produce too much parathyroid hormone (primary hyperparathyroidism).  · A buildup of uric acid crystals, similar to gout in your joints.  · Narrowing (stricture) of the ureter.  · A kidney obstruction present at birth (congenital obstruction).  · Previous surgery on the kidney or ureters.  · Numerous kidney infections.  SYMPTOMS   · Feeling sick to your stomach (nauseous).  · Throwing up (vomiting).  · Blood in the urine (hematuria).  · Pain that usually spreads (radiates) to the groin.  · Frequency or urgency of urination.  DIAGNOSIS   · Taking a history and physical exam.  · Blood or urine tests.  · CT scan.  · Occasionally, an examination of the inside of the urinary bladder (cystoscopy) is performed.  TREATMENT   · Observation.  · Increasing your fluid intake.  · Extracorporeal shock wave lithotripsy This is a noninvasive procedure that uses shock waves to break up kidney stones.  · Surgery may be needed if you have severe pain or persistent obstruction. There are various surgical procedures. Most of the procedures are performed with the use of small instruments. Only small incisions are needed to accommodate these instruments, so recovery time is minimized.  The size, location, and chemical composition are all important variables that will determine the proper choice of action for you. Talk to your health care provider to better understand your situation so that you will minimize the risk of injury to yourself and your kidney.   HOME CARE INSTRUCTIONS   · Drink enough water and fluids to keep your urine clear or pale yellow. This will help you to pass the stone or stone fragments.  · Strain  all urine through the provided strainer. Keep all particulate matter and stones for your health care provider to see. The stone causing the pain may be as small as a grain of salt. It is very important to use the strainer each and every time you pass your urine. The collection of your stone will allow your health care provider to analyze it and verify that a stone has actually passed. The stone analysis will often identify what you can do to reduce the incidence of recurrences.  · Only take over-the-counter or prescription medicines for pain, discomfort, or fever as directed by your health care provider.  · Make a follow-up appointment with your health care provider as directed.  · Get follow-up X-rays if required. The absence of pain does not always mean that the stone has passed. It may have only stopped moving. If the urine remains completely obstructed, it can cause loss of kidney function or even complete destruction of the kidney. It is your responsibility to make sure X-rays and follow-ups are completed. Ultrasounds of the kidney can show blockages and the status of the kidney. Ultrasounds are not associated with any radiation and can be performed easily in a matter of minutes.  SEEK MEDICAL CARE IF:  · You experience pain that is progressive and unresponsive to any pain medicine you have been prescribed.  SEEK IMMEDIATE MEDICAL CARE IF:   · Pain cannot be controlled with the prescribed medicine.  · You have a fever   or shaking chills.  · The severity or intensity of pain increases over 18 hours and is not relieved by pain medicine.  · You develop a new onset of abdominal pain.  · You feel faint or pass out.  · You are unable to urinate.  MAKE SURE YOU:   · Understand these instructions.  · Will watch your condition.  · Will get help right away if you are not doing well or get worse.  Document Released: 07/25/2005 Document Revised: 03/27/2013 Document Reviewed: 12/26/2012  [employer]® Patient Information ©2014  [employer].

## 2013-11-23 NOTE — ED Provider Notes (Signed)
Medical screening examination/treatment/procedure(s) were performed by non-physician practitioner and as supervising physician I was immediately available for consultation/collaboration.    Celene KrasJon R Helayne Metsker, MD 11/23/13 [phone]

## 2015-04-03 IMAGING — CR DG ABDOMEN 1V
1 series · 1 of 1 positions shown · non-contrast
Comparison: CT 06/17/2010.

CLINICAL DATA: Left flank pain. Prior history of kidney stones.

EXAM:
ABDOMEN - 1 VIEW

[t abdomen supine]
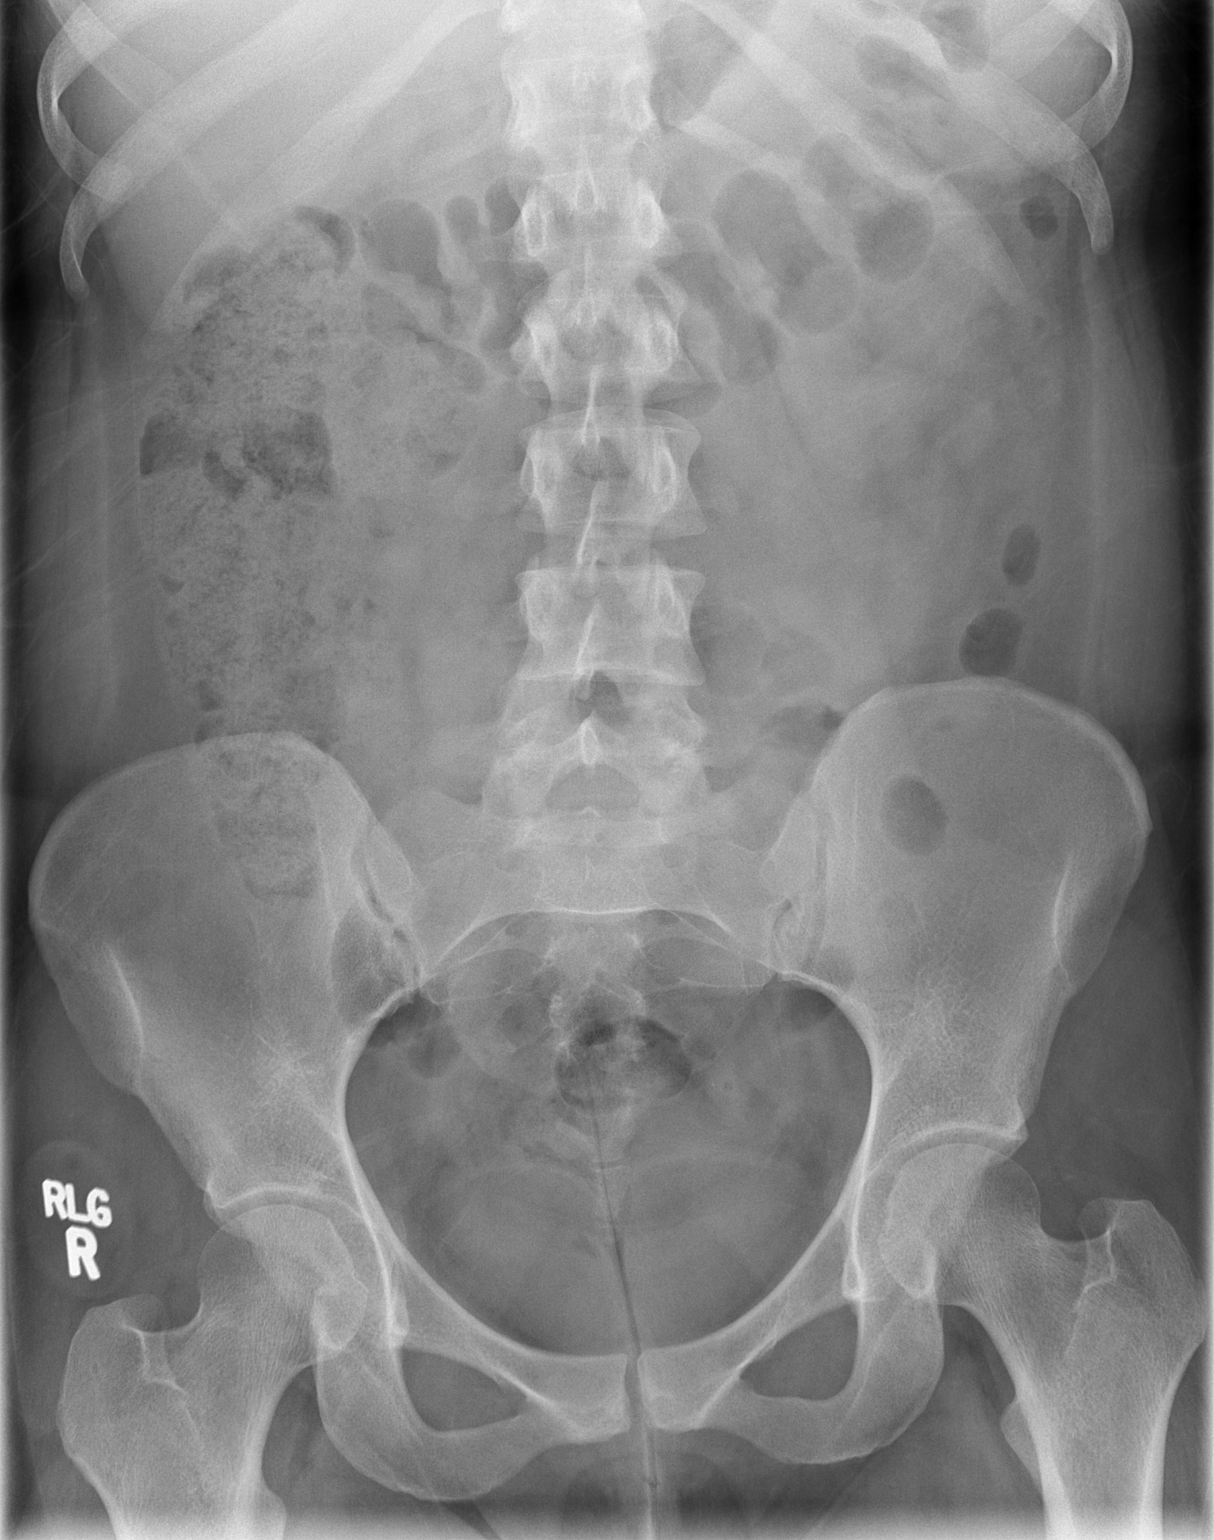

[1 of 1 positions shown; findings below may reference images not displayed]

FINDINGS: No renal calculi are identified. Large stool burden is present on
the right. Bowel gas appears within normal limits otherwise.
IMPRESSION: Normal single-view abdomen.

## 2015-04-04 IMAGING — US US RENAL
1 series · 14 of 24 positions shown · non-contrast
Comparison: CT of the abdomen and pelvis performed 06/17/2010

CLINICAL DATA: Left flank pain.

EXAM:
RENAL/URINARY TRACT ULTRASOUND COMPLETE

[Series 1: us renal · 0.24mm/px · 14 of 24 slices shown]
[im 1/24]
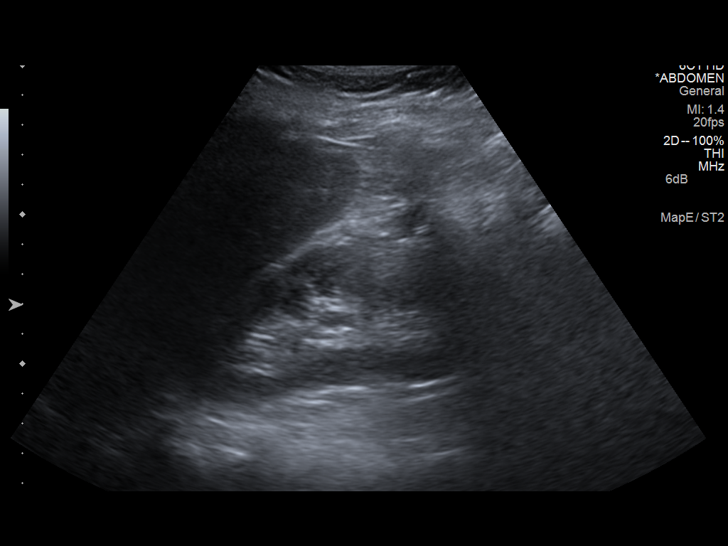
[im 3/24]
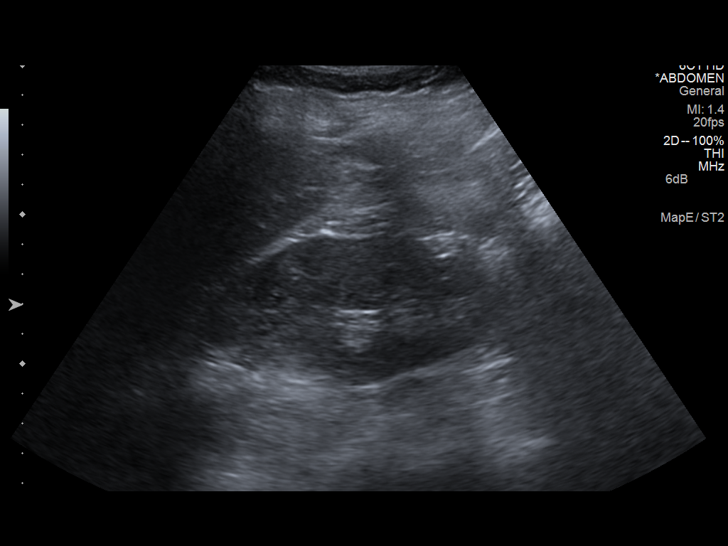
[im 5/24]
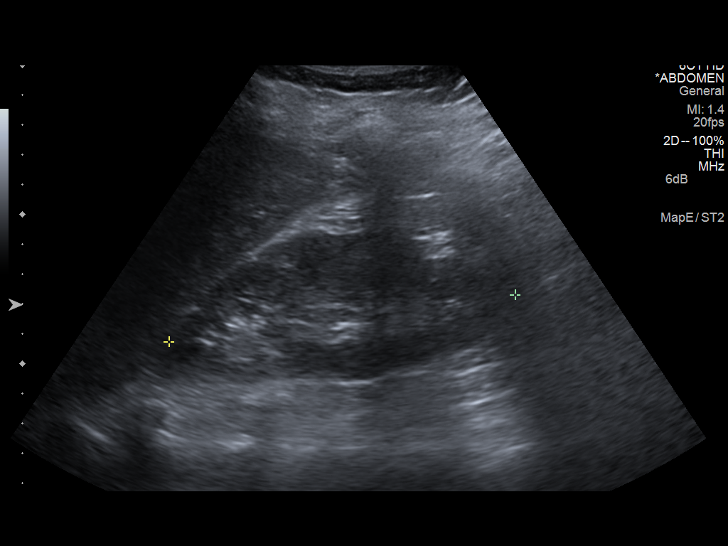
[im 7/24]
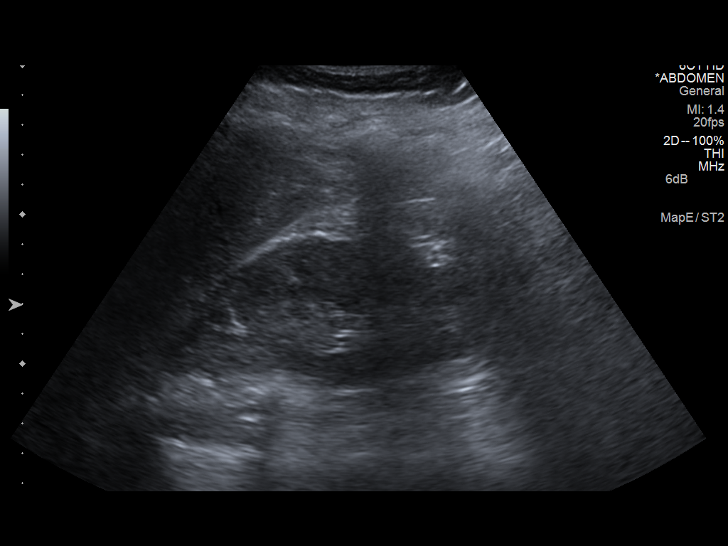
[im 8/24]
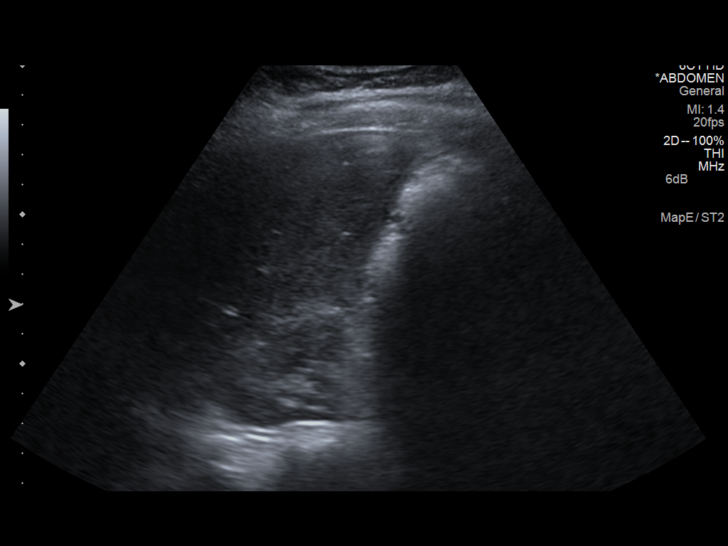
[im 10/24]
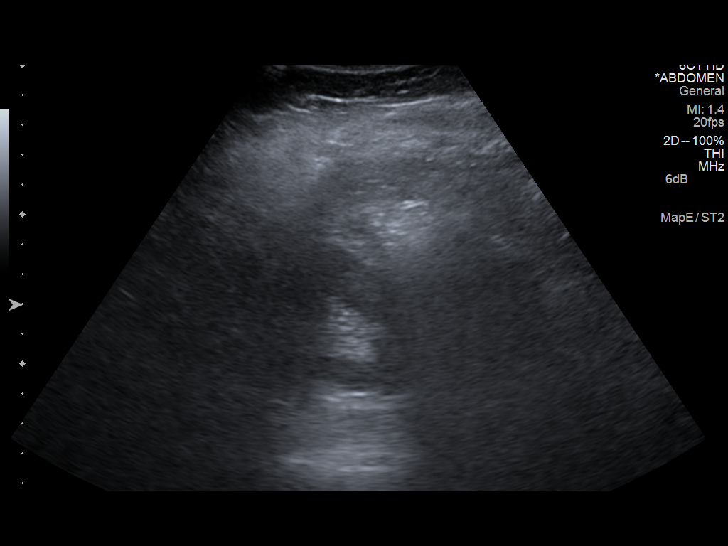
[im 12/24]
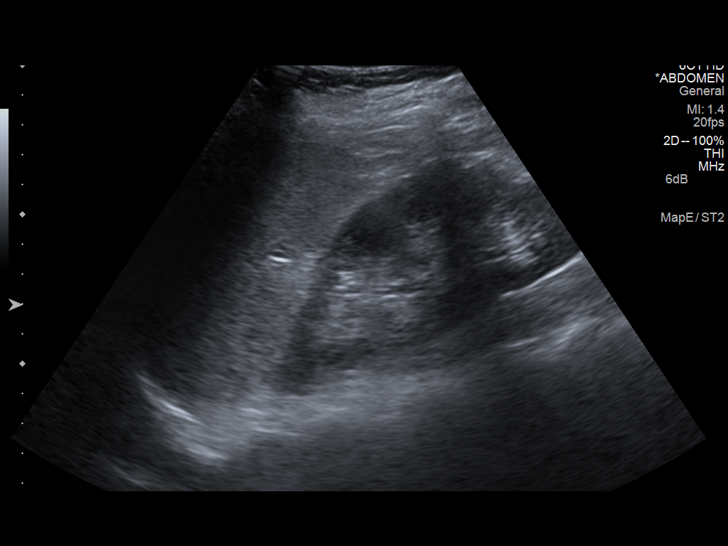
[im 13/24]
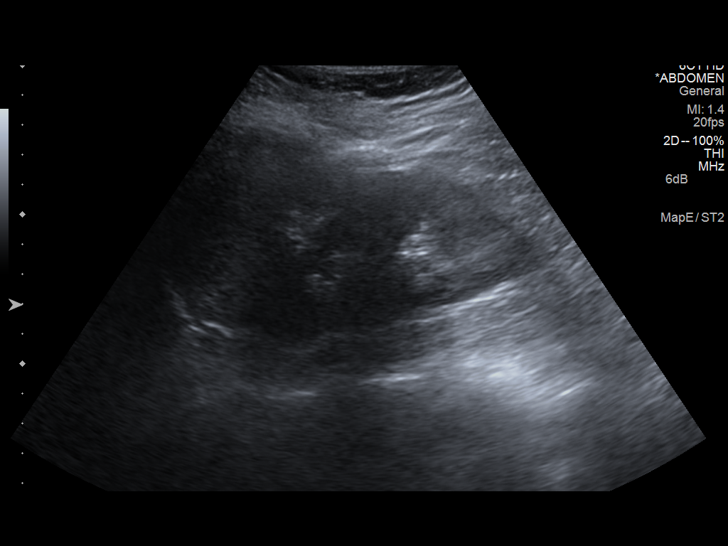
[im 15/24]
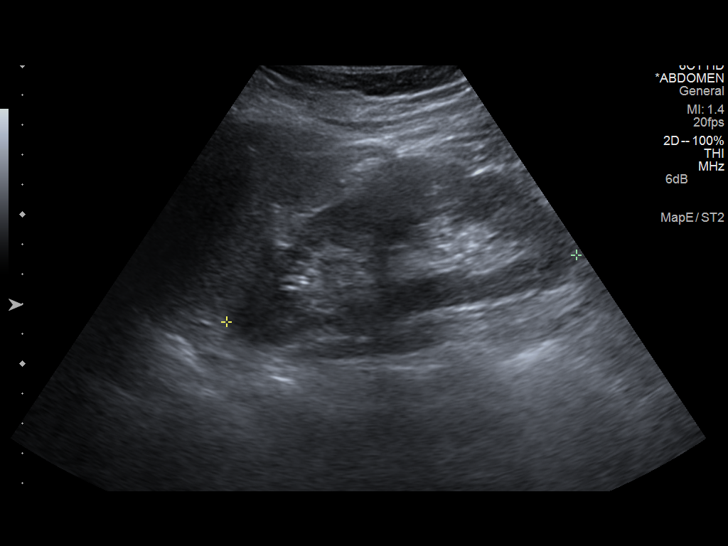
[im 17/24]
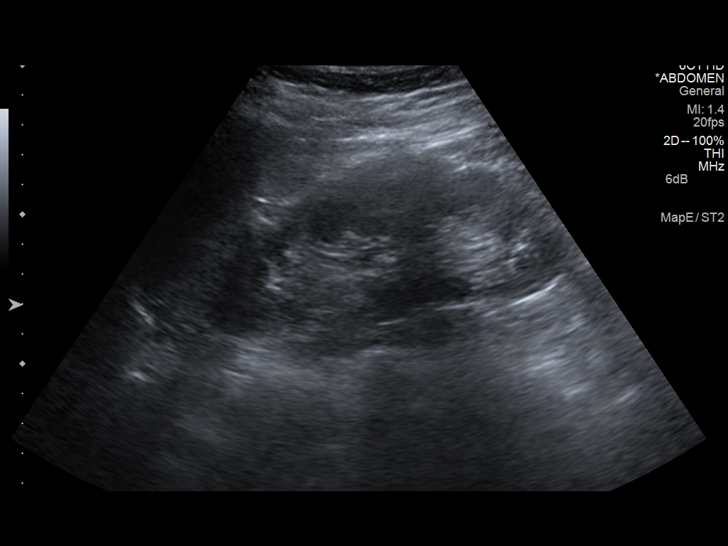
[im 19/24]
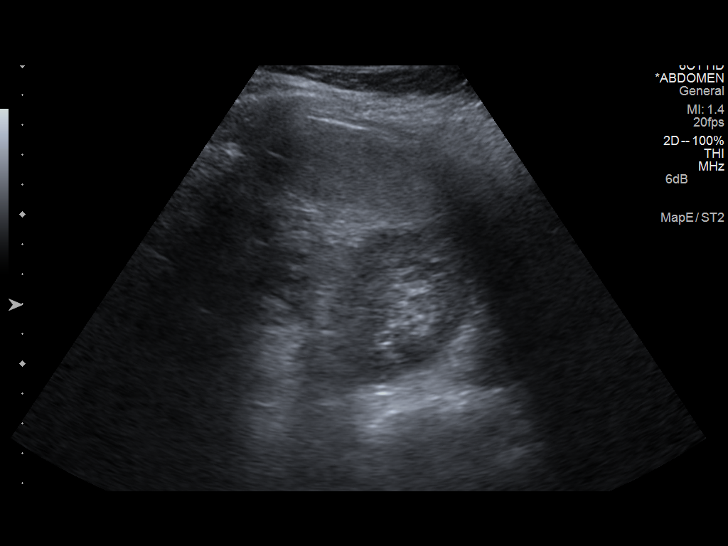
[im 20/24]
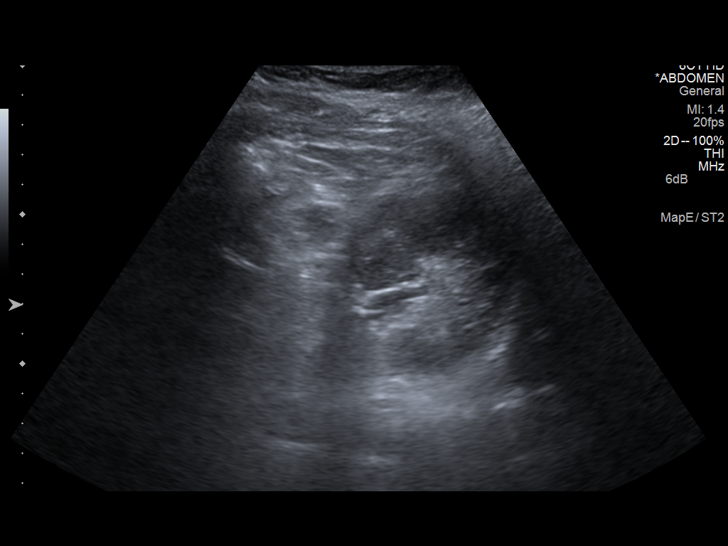
[im 22/24]
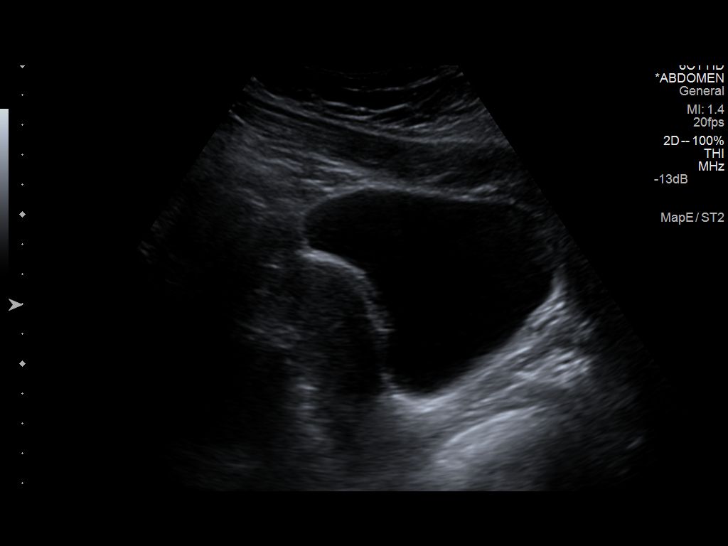
[im 24/24]
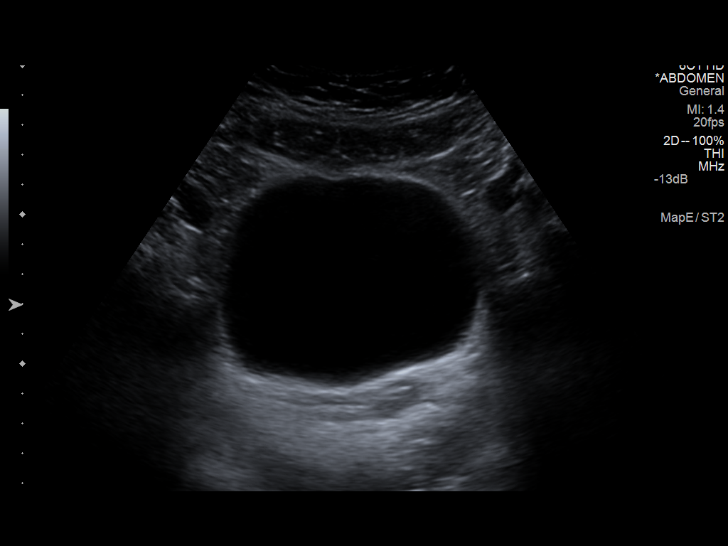

[14 of 24 positions shown; findings below may reference images not displayed]

FINDINGS: Right Kidney

Length: 11.7 cm. Echogenicity within normal limits. No mass or
hydronephrosis visualized.

Left Kidney

Length: 12.0 cm. Echogenicity within normal limits. No mass or
hydronephrosis visualized. The previously noted left renal stone is
not characterized on ultrasound.

Bladder

Appears normal for degree of bladder distention.
IMPRESSION: Unremarkable renal ultrasound.

## 2015-09-20 IMAGING — US US RENAL
1 series · 14 of 25 positions shown · non-contrast
Comparison: US RENAL dated 06/01/2013

CLINICAL DATA: Back pain

EXAM:
RENAL/URINARY TRACT ULTRASOUND COMPLETE

[Series 1: us renal · 0.21mm/px · 14 of 32 slices shown]
[im 1/32]
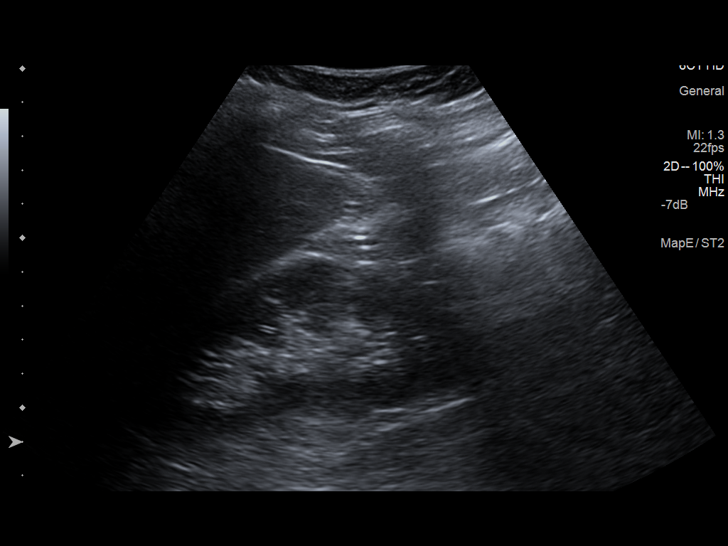
[im 3/32]
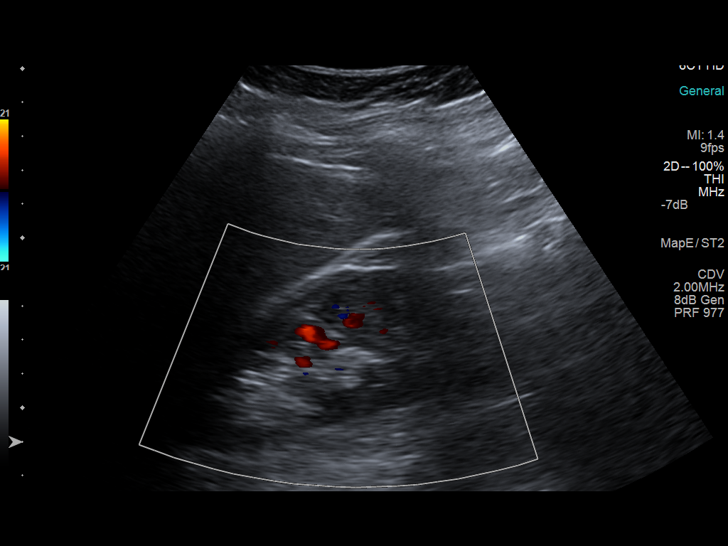
[im 6/32]
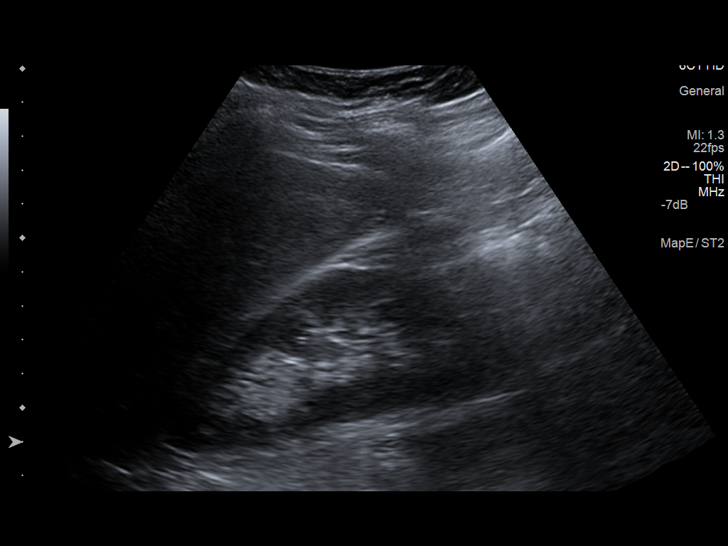
[im 8/32]
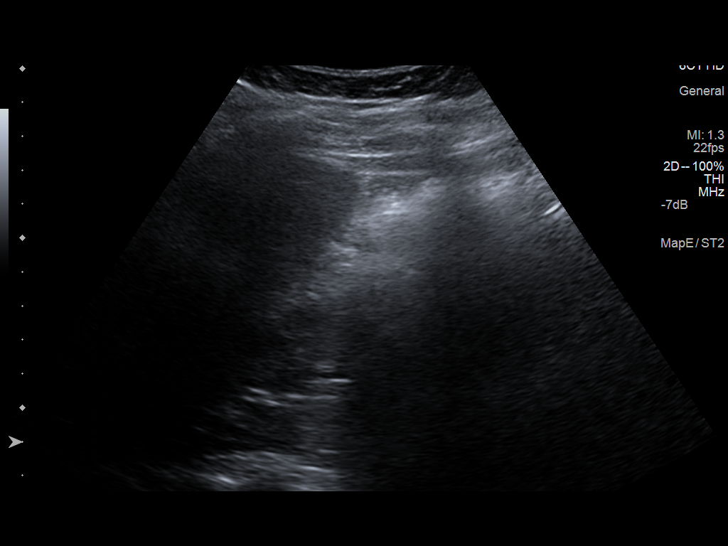
[im 11/32]
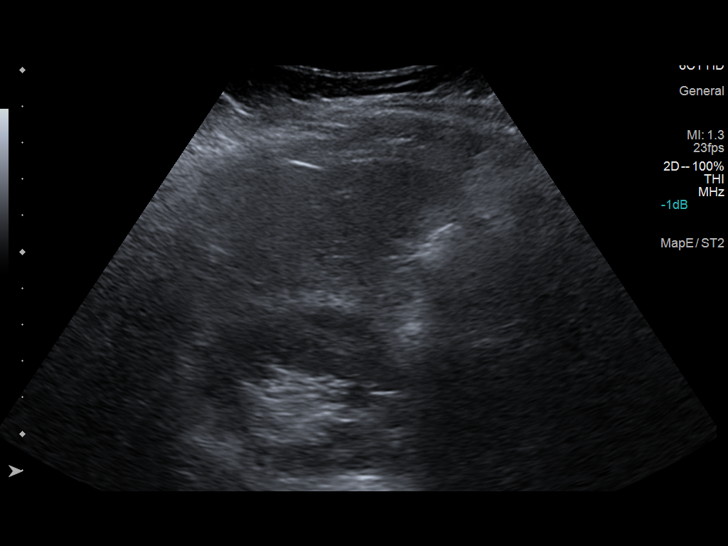
[im 12/32]
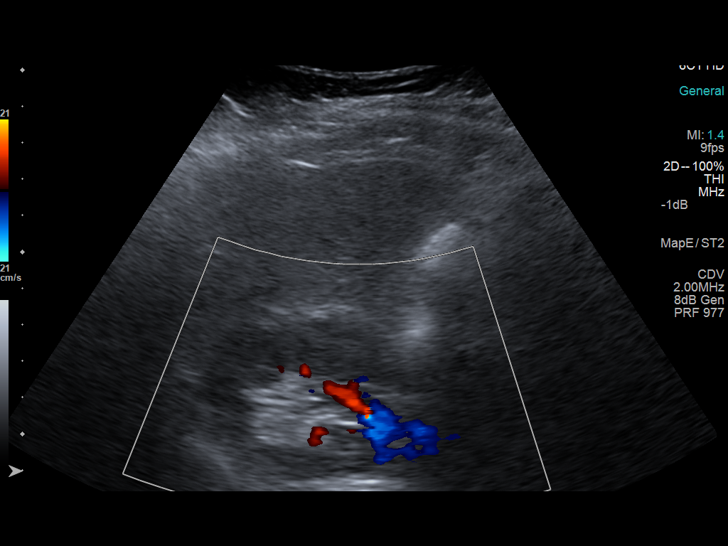
[im 15/32]
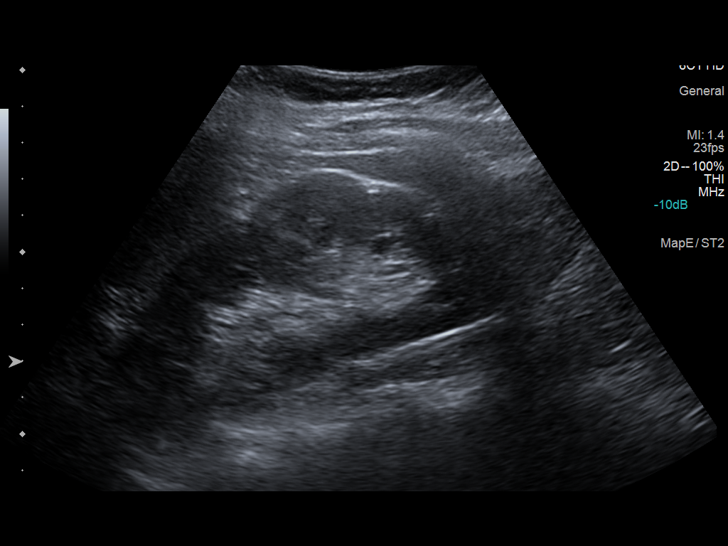
[im 17/32]
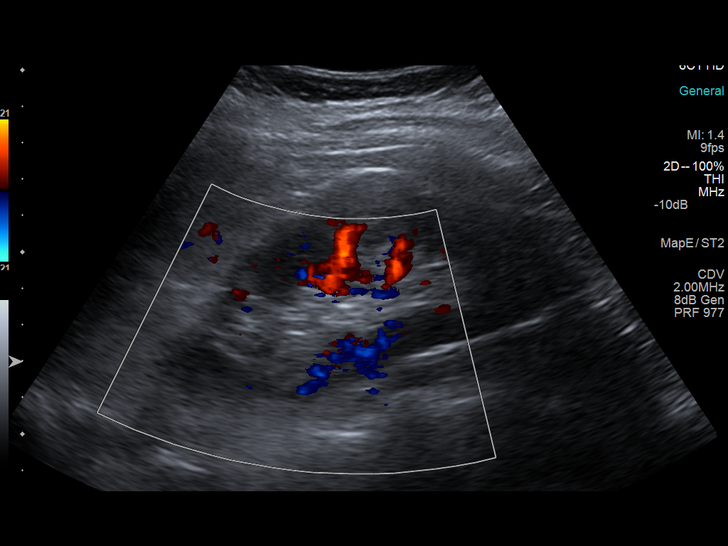
[im 20/32]
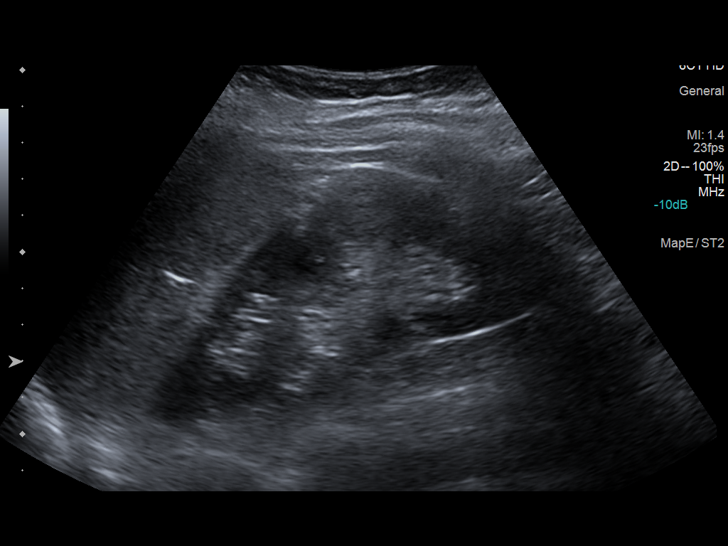
[im 21/32]
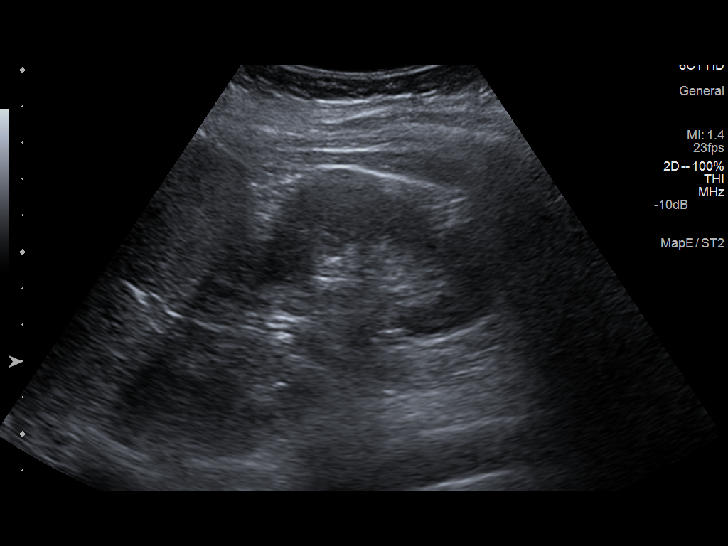
[im 24/32]
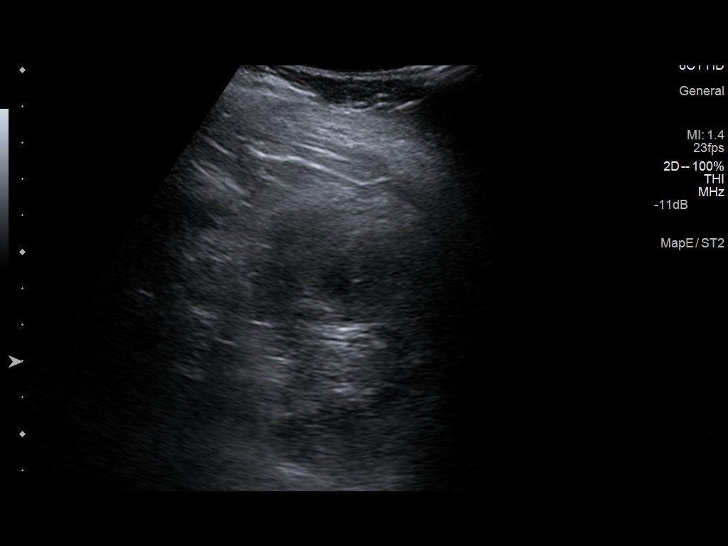
[im 26/32]
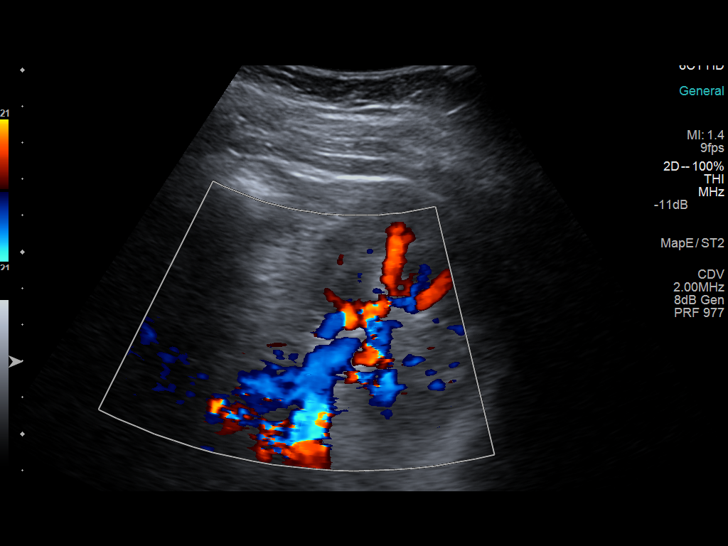
[im 29/32]
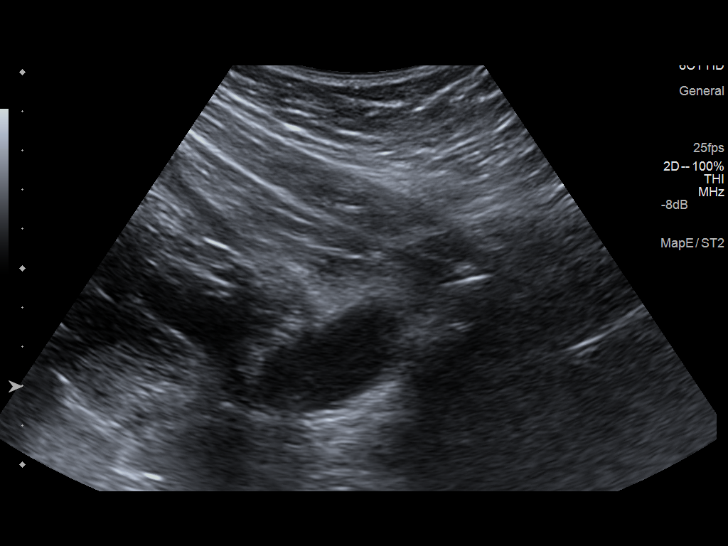
[im 32/32]
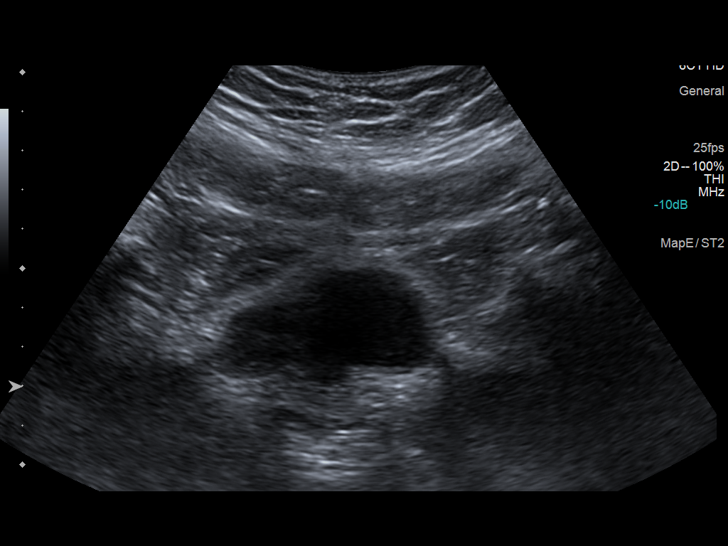

[14 of 25 positions shown; findings below may reference images not displayed]

FINDINGS: Right Kidney:

Length: 10.6 cm. Echogenicity within normal limits. No mass or
hydronephrosis visualized.

Left Kidney:

Length: 11.7 cm. Echogenicity within normal limits. No mass or
hydronephrosis visualized.

Bladder:

The bladder is decompressed limiting evaluation.
IMPRESSION: Normal renal ultrasound.
